# Patient Record
Sex: Female | Born: 1975 | Race: Black or African American | Hispanic: No | Marital: Single | Smoking: Current every day smoker
Health system: Southern US, Community
[De-identification: ages and names within clinical notes are randomized; demographics above are authoritative.]

## PROBLEM LIST (undated history)

## (undated) DIAGNOSIS — N921 Excessive and frequent menstruation with irregular cycle: Secondary | ICD-10-CM

## (undated) DIAGNOSIS — F4321 Adjustment disorder with depressed mood: Secondary | ICD-10-CM

## (undated) DIAGNOSIS — D500 Iron deficiency anemia secondary to blood loss (chronic): Secondary | ICD-10-CM

## (undated) DIAGNOSIS — G43909 Migraine, unspecified, not intractable, without status migrainosus: Secondary | ICD-10-CM

## (undated) DIAGNOSIS — D649 Anemia, unspecified: Secondary | ICD-10-CM

## (undated) DIAGNOSIS — R51 Headache: Secondary | ICD-10-CM

---

## 1998-01-25 ENCOUNTER — Inpatient Hospital Stay (HOSPITAL_COMMUNITY): Admission: AD | Admit: 1998-01-25 | Discharge: 1998-01-25 | Payer: Self-pay | Admitting: *Deleted

## 1998-04-12 ENCOUNTER — Ambulatory Visit (HOSPITAL_COMMUNITY): Admission: RE | Admit: 1998-04-12 | Discharge: 1998-04-12 | Payer: Self-pay | Admitting: Obstetrics & Gynecology

## 1998-04-21 ENCOUNTER — Inpatient Hospital Stay (HOSPITAL_COMMUNITY): Admission: AD | Admit: 1998-04-21 | Discharge: 1998-04-21 | Payer: Self-pay | Admitting: *Deleted

## 1998-04-22 ENCOUNTER — Inpatient Hospital Stay (HOSPITAL_COMMUNITY): Admission: AD | Admit: 1998-04-22 | Discharge: 1998-04-22 | Payer: Self-pay | Admitting: Obstetrics & Gynecology

## 1998-06-29 ENCOUNTER — Inpatient Hospital Stay (HOSPITAL_COMMUNITY): Admission: AD | Admit: 1998-06-29 | Discharge: 1998-07-03 | Payer: Self-pay | Admitting: Obstetrics

## 1999-08-05 ENCOUNTER — Encounter: Payer: Self-pay | Admitting: *Deleted

## 1999-08-05 ENCOUNTER — Inpatient Hospital Stay (HOSPITAL_COMMUNITY): Admission: AD | Admit: 1999-08-05 | Discharge: 1999-08-05 | Payer: Self-pay | Admitting: *Deleted

## 1999-09-24 ENCOUNTER — Observation Stay (HOSPITAL_COMMUNITY): Admission: AD | Admit: 1999-09-24 | Discharge: 1999-09-25 | Payer: Self-pay | Admitting: Obstetrics

## 1999-09-25 ENCOUNTER — Encounter: Payer: Self-pay | Admitting: Obstetrics

## 2000-12-03 HISTORY — PX: TUBAL LIGATION: SHX77

## 2001-01-14 ENCOUNTER — Inpatient Hospital Stay (HOSPITAL_COMMUNITY): Admission: AD | Admit: 2001-01-14 | Discharge: 2001-01-14 | Payer: Self-pay | Admitting: *Deleted

## 2001-03-06 ENCOUNTER — Inpatient Hospital Stay (HOSPITAL_COMMUNITY): Admission: AD | Admit: 2001-03-06 | Discharge: 2001-03-06 | Payer: Self-pay | Admitting: Obstetrics & Gynecology

## 2001-03-06 ENCOUNTER — Encounter: Payer: Self-pay | Admitting: Obstetrics & Gynecology

## 2001-04-11 ENCOUNTER — Encounter: Admission: RE | Admit: 2001-04-11 | Discharge: 2001-04-11 | Payer: Self-pay | Admitting: Family Medicine

## 2001-05-15 ENCOUNTER — Ambulatory Visit (HOSPITAL_COMMUNITY): Admission: RE | Admit: 2001-05-15 | Discharge: 2001-05-15 | Payer: Self-pay | Admitting: *Deleted

## 2001-06-02 ENCOUNTER — Inpatient Hospital Stay (HOSPITAL_COMMUNITY): Admission: AD | Admit: 2001-06-02 | Discharge: 2001-06-05 | Payer: Self-pay | Admitting: *Deleted

## 2001-07-02 ENCOUNTER — Encounter (INDEPENDENT_AMBULATORY_CARE_PROVIDER_SITE_OTHER): Payer: Self-pay | Admitting: Specialist

## 2001-07-02 ENCOUNTER — Inpatient Hospital Stay (HOSPITAL_COMMUNITY): Admission: AD | Admit: 2001-07-02 | Discharge: 2001-07-04 | Payer: Self-pay | Admitting: *Deleted

## 2001-09-15 ENCOUNTER — Emergency Department (HOSPITAL_COMMUNITY): Admission: EM | Admit: 2001-09-15 | Discharge: 2001-09-15 | Payer: Self-pay | Admitting: Emergency Medicine

## 2001-12-26 ENCOUNTER — Emergency Department (HOSPITAL_COMMUNITY): Admission: EM | Admit: 2001-12-26 | Discharge: 2001-12-26 | Payer: Self-pay | Admitting: Emergency Medicine

## 2002-12-14 ENCOUNTER — Inpatient Hospital Stay (HOSPITAL_COMMUNITY): Admission: AD | Admit: 2002-12-14 | Discharge: 2002-12-14 | Payer: Self-pay | Admitting: *Deleted

## 2002-12-16 ENCOUNTER — Inpatient Hospital Stay (HOSPITAL_COMMUNITY): Admission: AD | Admit: 2002-12-16 | Discharge: 2002-12-16 | Payer: Self-pay | Admitting: Obstetrics and Gynecology

## 2004-02-29 ENCOUNTER — Emergency Department (HOSPITAL_COMMUNITY): Admission: EM | Admit: 2004-02-29 | Discharge: 2004-02-29 | Payer: Self-pay | Admitting: Emergency Medicine

## 2006-02-22 ENCOUNTER — Emergency Department (HOSPITAL_COMMUNITY): Admission: EM | Admit: 2006-02-22 | Discharge: 2006-02-23 | Payer: Self-pay | Admitting: Emergency Medicine

## 2008-05-06 ENCOUNTER — Emergency Department (HOSPITAL_COMMUNITY): Admission: EM | Admit: 2008-05-06 | Discharge: 2008-05-07 | Payer: Self-pay | Admitting: Emergency Medicine

## 2008-09-12 ENCOUNTER — Emergency Department (HOSPITAL_COMMUNITY): Admission: EM | Admit: 2008-09-12 | Discharge: 2008-09-12 | Payer: Self-pay | Admitting: Emergency Medicine

## 2009-06-16 IMAGING — CT CT ABDOMEN W/O CM
2 of 4 series · 14 of 32 positions shown, 19 images · non-contrast
Comparison: None.

CT ABDOMEN

CLINICAL DATA: Left flank pain.  Question ureteral calculus.

CT ABDOMEN AND PELVIS WITHOUT CONTRAST
TECHNIQUE: Multidetector CT imaging of the abdomen and pelvis was
performed following the standard
protocol without intravenous contrast.

[Series 2: <(id) stone a/p >(id) · axial · 0.74mm/px · z∈[-456,-126]mm · 7 of 88 slices shown, 12 images]
[im 11/88  soft-tissue]
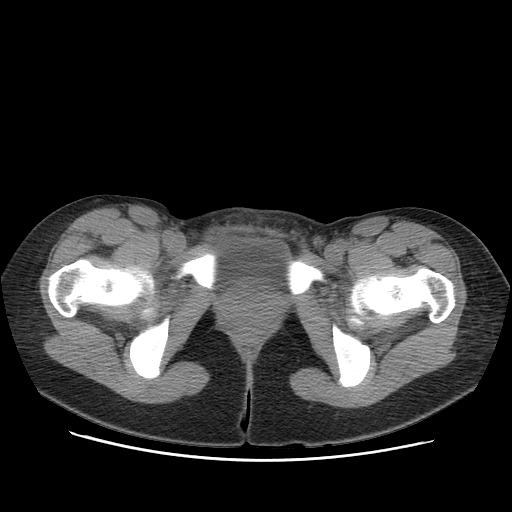
[im 11/88  bone]
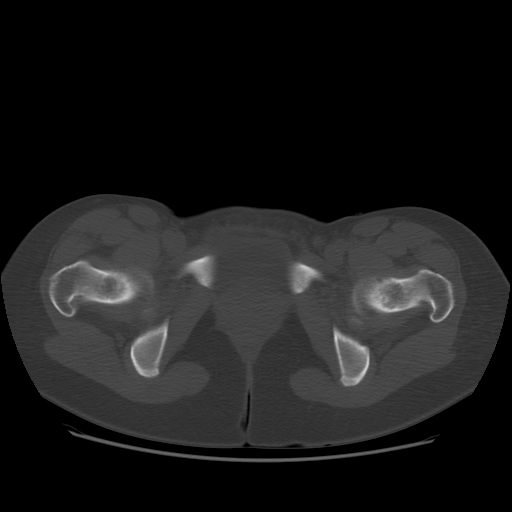
[im 22/88  soft-tissue]
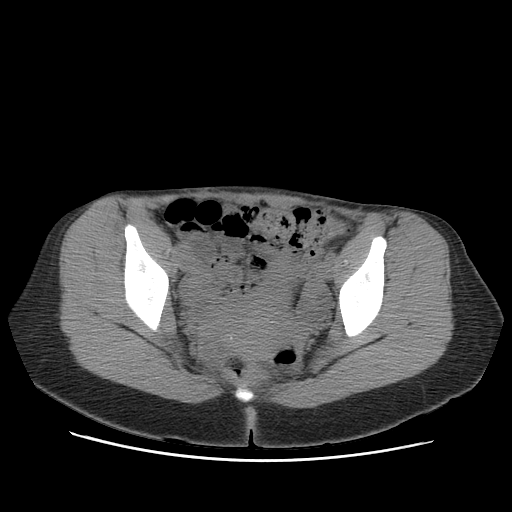
[im 33/88  soft-tissue]
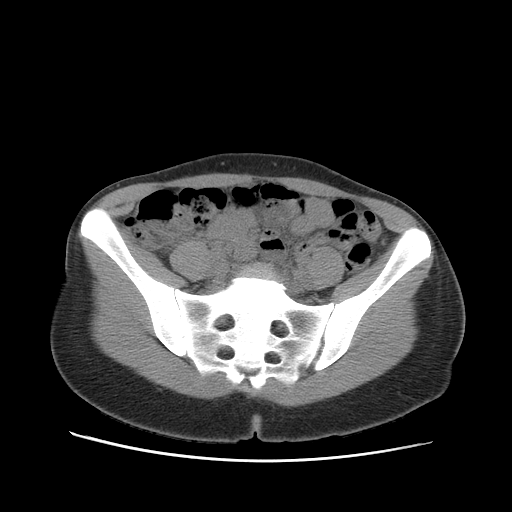
[im 44/88  soft-tissue]
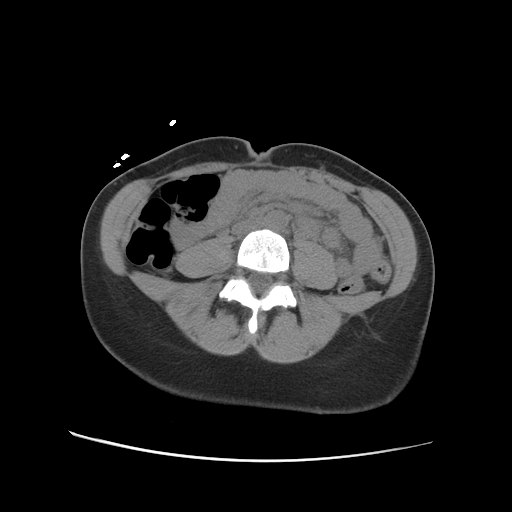
[im 44/88  lung]
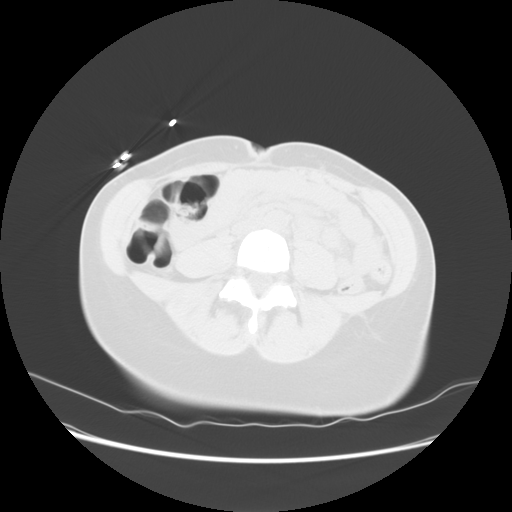
[im 55/88  soft-tissue]
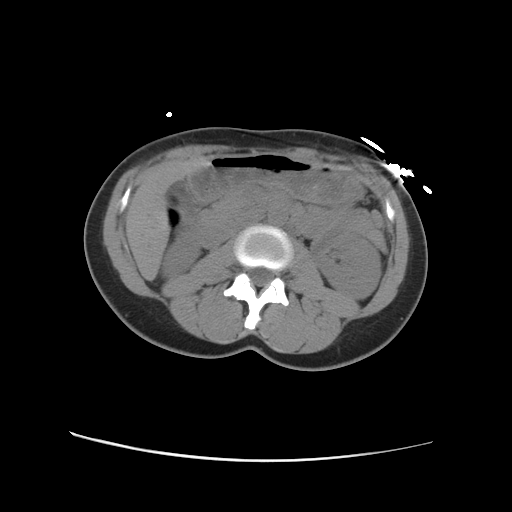
[im 55/88  lung]
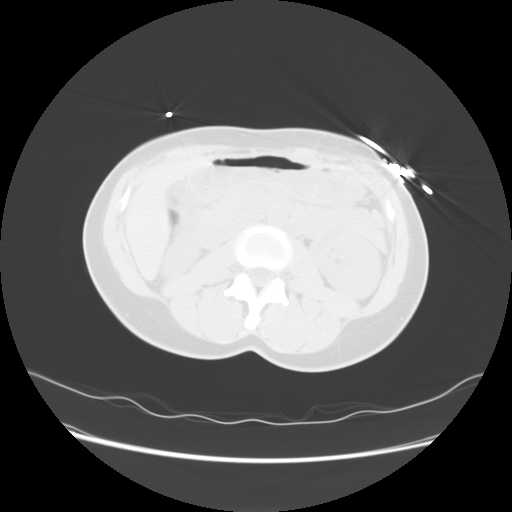
[im 66/88  soft-tissue]
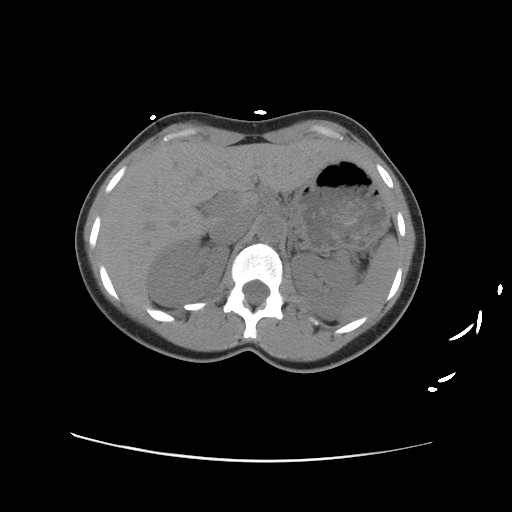
[im 66/88  lung]
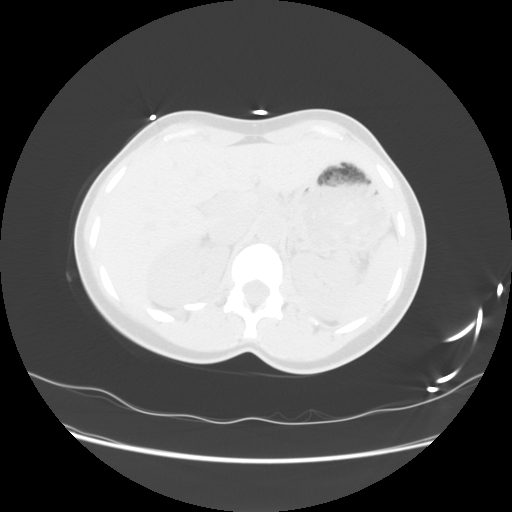
[im 77/88  soft-tissue]
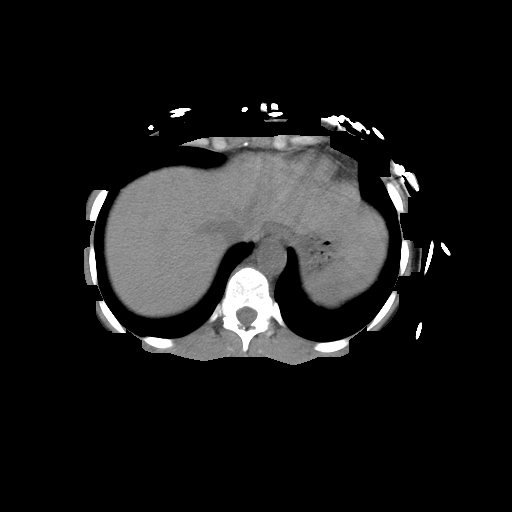
[im 77/88  lung]
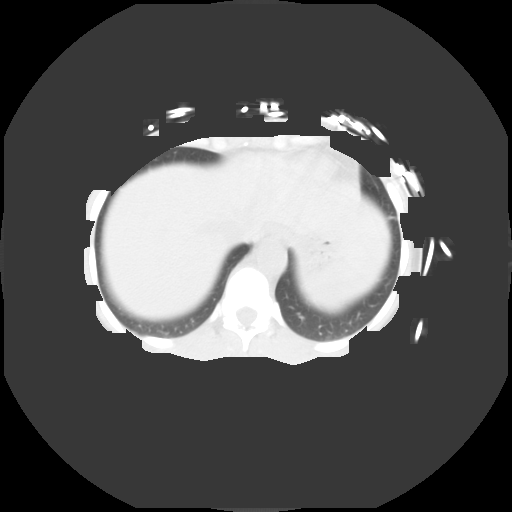

[Series 400: reformatted · sagittal · 0.87mm/px · 7 of 91 slices shown]
[im 10/91  soft-tissue]
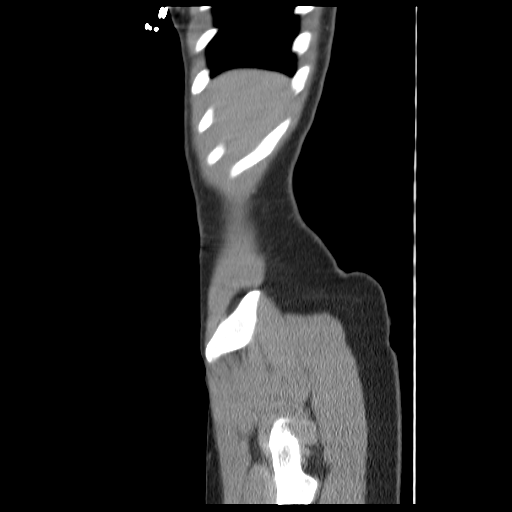
[im 19/91  soft-tissue]
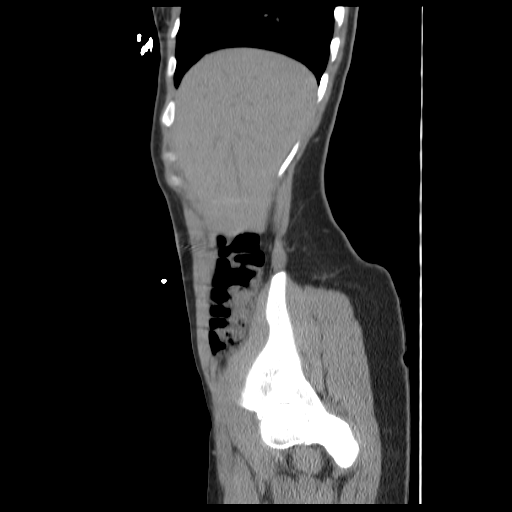
[im 28/91  soft-tissue]
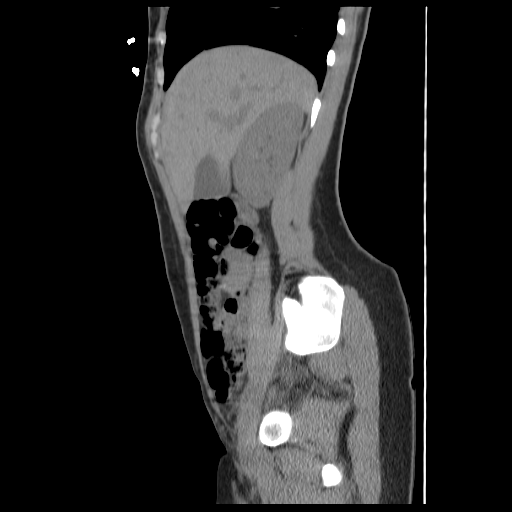
[im 37/91  soft-tissue]
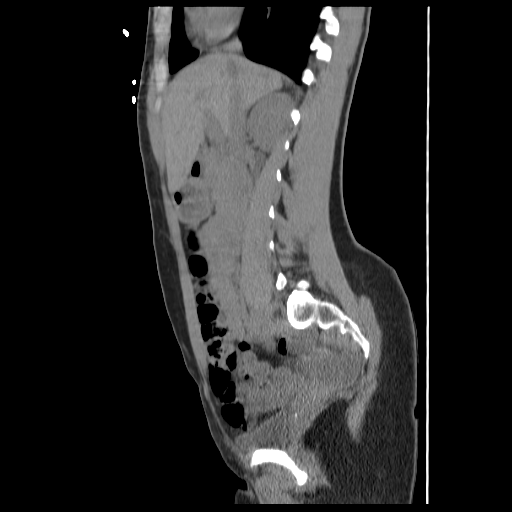
[im 55/91  soft-tissue]
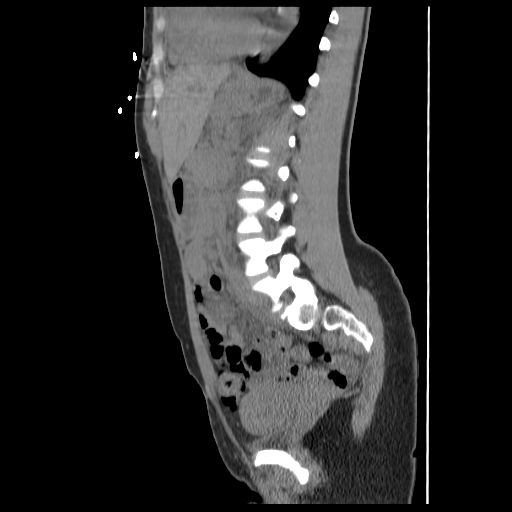
[im 64/91  soft-tissue]
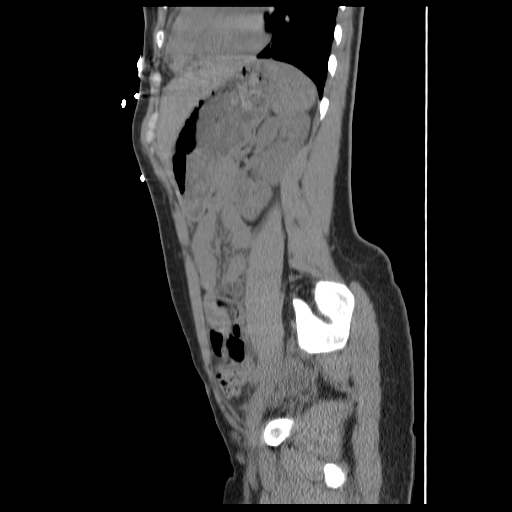
[im 73/91  soft-tissue]
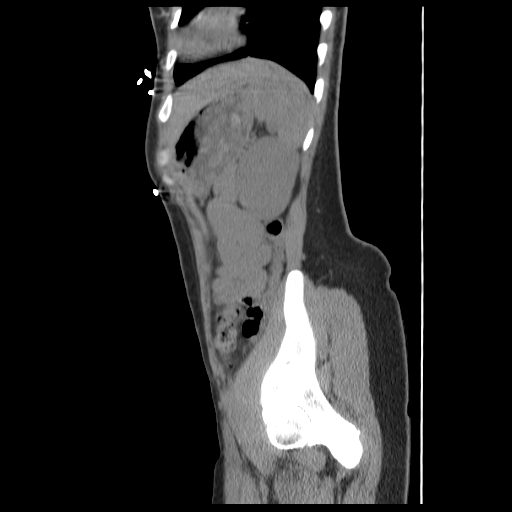

[14 of 32 positions shown; findings below may reference images not displayed]

FINDINGS: The patient has limited intra-abdominal fat.  No renal
calculi are demonstrated.  There is no hydronephrosis or
perinephric soft tissue stranding.  No intra-abdominal ureteral
calculi are demonstrated.  The kidneys appear unremarkable as
imaged in the unenhanced state.

The lung bases are clear.  The remainder the visualized abdomen
appears unremarkable as imaged in the unenhanced state, but as such
is suboptimally evaluated.
IMPRESSION: 1.  Negative for urinary tract calculus or hydronephrosis.
2.  No acute abdominal findings are demonstrated.

CT PELVIS
FINDINGS: The ureters are not dilated and are difficult to trace
into the pelvis.  No ureteral or bladder calculus is seen.  There
are small bilateral pelvic calcifications which are probably all
phleboliths.  The uterus and ovaries appear unremarkable.  No
inflammatory changes are evident.
IMPRESSION: 1.  No evidence of ureteral calculus or hydronephrosis. The distal
ureters are difficult to individually trace to the bladder.
2.  No demonstrated pelvic inflammatory process.

## 2010-03-21 ENCOUNTER — Emergency Department (HOSPITAL_COMMUNITY): Admission: EM | Admit: 2010-03-21 | Discharge: 2010-03-21 | Payer: Self-pay | Admitting: Emergency Medicine

## 2011-04-20 NOTE — Discharge Summary (Signed)
[facility]  Patient:    Diana Mcdowell, Diana Mcdowell                      MRN: [id number] Adm. Date:  [id number] Disc. Date: [id number] Attending:  Tammi Sou Dictator:   Laruth Bouchard, M.D.                           Discharge Summary  DISCHARGE DIAGNOSES:          1. Uterine irritability.                               2. Cervicitis.  DISCHARGE MEDICATIONS:        1. Prenatal vitamins.                               2. Ferrous sulfate 325 mg one p.o. b.i.d. with                                  food.  HISTORY OF PRESENT ILLNESS:   The patient is a 35 year old, gravida 4, para 2-1-0-3, who presented at 36 weeks with complaints of contractions and loss of mucous plug.  The patient denied any vaginal bleeding or leakage of fluid. The patient receives prenatal care at [facility].  This pregnancy has been complicated by bacterial vaginosis and anemia.  PHYSICAL EXAMINATION:         VITAL SIGNS: Stable.  The patient is afebrile. HEART: Regular rate and rhythm. LUNGS: Clear to auscultation bilaterally. ABDOMEN: Nonacute, nontender.  EXTREMITIES: No edema. Fetal heart rate tracing is reassuring with contractions occurring every two to three minutes and lasting for 30 to 40 seconds of moderate intensity.  Wet prep results revealed positive Wif test, large WBCs, 4+ bacteria, and lots of clue cells.  HOSPITAL COURSE:              The patients vital signs remained stable.  The patient was afebrile throughout hospital course.  For contractions, the patient was started on Terbutaline 0.25 mg s.q.  Contractions decreased and no cervical change was noted.  For the cervicitis, the patient was treated with Unasyn 3 grams IV q.6h.  CONDITION ON DISCHARGE:       Stable.  FOLLOW-UP:                    At [facility] next week, the patient has an appointment scheduled. DD:  06/05/01 TD:  06/05/01 Job: [id number]

## 2011-04-20 NOTE — Op Note (Signed)
[facility]  Patient:    Diana Mcdowell, Diana Mcdowell                      MRN: [id number] Proc. Date: 07/03/01 Adm. Date:  [id number] Attending:  Tammi Sou                           Operative Report  PREOPERATIVE DIAGNOSIS:       Multiparity, desires sterilization procedure.  POSTOPERATIVE DIAGNOSIS:      Multiparity, desires sterilization procedure.  OPERATION:                    Modified Pomeroy bilateral tubal ligation.  SURGEON:                      Roseanna Rainbow, M.D.  ASSISTANT:  ANESTHESIA:                   General endotracheal anesthesia.  ESTIMATED BLOOD LOSS:         Less than 50 cc.  COMPLICATIONS:                None.  FLUIDS:                       As per anesthesiology.  INDICATIONS:                  The patient is a 35 year old, para 3, status post NSVD, who desires a sterilization procedure.  The risks, benefits, and alternative forms of management were reviewed with the patient including, but not limited to failure rate of 4 to 8 per 1000 cases with a subsequent increase risk of ectopic pregnancy.  DESCRIPTION OF PROCEDURE:     The patient was taken to the operating room. General anesthesia was administered without difficulty.  She was then placed in the dorsal supine position and prepped and draped in the usual sterile fashion.  Previous subumbilical scar was then infiltrated with Marcaine.  This scar was then incised with a scalpel and carried down through the fascia.  The parietoperitoneum was then tented up and entered sharply with Metzenbaum scissors.  This incision was then extended bilaterally.  The incision was felt to be free of any adhesions.  The left fallopian tube was then grasped with a Babcock clamp and the fimbriated end was visualized.  The tube was then grasped approximately 4 cm from the uterine cornu.  Two ligatures of 0 plain were then placed around the loop of tube.  This loop of tube was  then excised. Excellent hemostasis was noted.  The right fallopian tube was then manipulated in a similar fashion.  The fascia was reapproximated with 0 Vicryl.  The skin was reapproximated in a subcuticular fashion with 3-0 Vicryl.  Excellent hemostasis was noted.  At the close of the procedure, the instrument and pack counts were said to be correct x 2.  The patient was taken to the PACU awake and in stable condition. DD:  07/03/01 TD:  07/03/01 Job: [id number]

## 2011-08-30 LAB — POCT CARDIAC MARKERS
CKMB, poc: <1 — ABNORMAL LOW
Troponin i, poc: <0.05

## 2011-08-30 LAB — COMPREHENSIVE METABOLIC PANEL
Alkaline Phosphatase: 46
BUN: 7
Calcium: 9.2
Glucose, Bld: 93
Potassium: 3.1 — ABNORMAL LOW
Total Protein: 7.5

## 2011-08-30 LAB — CBC
HCT: 33.3 — ABNORMAL LOW
Hemoglobin: 11 — ABNORMAL LOW
MCHC: 33.2
RDW: 16.1 — ABNORMAL HIGH

## 2011-08-30 LAB — DIFFERENTIAL
Basophils Relative: 1
Monocytes Relative: 8
Neutro Abs: 6
Neutrophils Relative %: 55

## 2011-08-30 LAB — URINALYSIS, ROUTINE W REFLEX MICROSCOPIC
Glucose, UA: NEGATIVE
Nitrite: NEGATIVE
Protein, ur: NEGATIVE
Urobilinogen, UA: 1

## 2011-08-30 LAB — URINE MICROSCOPIC-ADD ON

## 2011-08-30 LAB — LIPASE, BLOOD: Lipase: 19

## 2015-10-05 ENCOUNTER — Emergency Department (HOSPITAL_COMMUNITY)
Admission: EM | Admit: 2015-10-05 | Discharge: 2015-10-05 | Disposition: A | Discharge status: Home / Self Care | Payer: Self-pay | Attending: Emergency Medicine | Admitting: Emergency Medicine

## 2015-10-05 ENCOUNTER — Other Ambulatory Visit: Payer: Self-pay

## 2015-10-05 ENCOUNTER — Emergency Department (HOSPITAL_COMMUNITY): Payer: Self-pay

## 2015-10-05 ENCOUNTER — Encounter (HOSPITAL_COMMUNITY): Payer: Self-pay | Admitting: *Deleted

## 2015-10-05 DIAGNOSIS — D591 Other autoimmune hemolytic anemias: Secondary | ICD-10-CM | POA: Insufficient documentation

## 2015-10-05 DIAGNOSIS — R0789 Other chest pain: Secondary | ICD-10-CM | POA: Insufficient documentation

## 2015-10-05 DIAGNOSIS — D649 Anemia, unspecified: Secondary | ICD-10-CM

## 2015-10-05 DIAGNOSIS — Z720 Tobacco use: Secondary | ICD-10-CM | POA: Insufficient documentation

## 2015-10-05 DIAGNOSIS — F5089 Other specified eating disorder: Secondary | ICD-10-CM | POA: Insufficient documentation

## 2015-10-05 LAB — CBC
HEMATOCRIT: 28.7 % — AB (ref 36.0–46.0)
Hemoglobin: 8.5 g/dL — ABNORMAL LOW (ref 12.0–15.0)
MCH: 19.8 pg — ABNORMAL LOW (ref 26.0–34.0)
MCHC: 29.6 g/dL — AB (ref 30.0–36.0)
MCV: 66.7 fL — ABNORMAL LOW (ref 78.0–100.0)
PLATELETS: 456 10*3/uL — AB (ref 150–400)
RBC: 4.3 MIL/uL (ref 3.87–5.11)
RDW: 19.3 % — AB (ref 11.5–15.5)
WBC: 8.7 10*3/uL (ref 4.0–10.5)

## 2015-10-05 LAB — BASIC METABOLIC PANEL
Anion gap: 9 (ref 5–15)
BUN: 9 mg/dL (ref 6–20)
CALCIUM: 9.4 mg/dL (ref 8.9–10.3)
CO2: 22 mmol/L (ref 22–32)
CREATININE: 0.76 mg/dL (ref 0.44–1.00)
Chloride: 105 mmol/L (ref 101–111)
GFR calc Af Amer: >60 mL/min (ref >60)
GLUCOSE: 87 mg/dL (ref 65–99)
POTASSIUM: 3.6 mmol/L (ref 3.5–5.1)
SODIUM: 136 mmol/L (ref 135–145)

## 2015-10-05 LAB — I-STAT TROPONIN, ED: Troponin i, poc: 0 ng/mL (ref 0.00–0.08)

## 2015-10-05 NOTE — ED Provider Notes (Signed)
CSN: [id number]     Arrival date & time 10/05/15  1438 History   First MD Initiated Contact with Patient 10/05/15 1624     Chief Complaint  Patient presents with  . Chest Pain     (Consider location/radiation/quality/duration/timing/severity/associated sxs/prior Treatment) HPI patient presents with concern of chest pain. Patient has been present intermittently for days, and the most recent episode was last night. Episodes occur without any clear precipitant, stop without any clear intervention. H episode lasts moments. Is diffuse, with mild associated dyspnea. There is no pleuritic pain, no radiation of the pain, the pain is anterior, tight. Patient has no other ongoing complaint, including no abdominal pain, lightheadedness, syncope, nausea, fever, chills. Patient denies substantial medical problems, but eventually acknowledges history of anemia, intermittently in the past. Patient is notable recent social history of her son being assassinated 3 weeks ago. Patient smokes.  Smoking cessation provided, particularly in light of this patient's evaluation in the ED.   History reviewed. No pertinent past medical history. History reviewed. No pertinent past surgical history. No family history on file. Social History  Substance Use Topics  . Smoking status: Current Every Day Smoker -- 0.50 packs/day    Types: Cigarettes  . Smokeless tobacco: None  . Alcohol Use: None   OB History    No data available     Review of Systems  Constitutional:       Per HPI, otherwise negative  HENT:       Per HPI, otherwise negative  Respiratory:       Per HPI, otherwise negative  Cardiovascular:       Per HPI, otherwise negative  Gastrointestinal: Negative for vomiting.  Endocrine:       Negative aside from HPI  Genitourinary:       Neg aside from HPI   Musculoskeletal:       Per HPI, otherwise negative  Skin: Negative.   Neurological: Negative for syncope.      Allergies  Review of  patient's allergies indicates no known allergies.  Home Medications   Prior to Admission medications   Medication Sig Start Date End Date Taking? Authorizing Provider  Naproxen Sod-Diphenhydramine (ALEVE PM) 220-25 MG TABS Take 2 tablets by mouth at bedtime as needed (pain, headaches).   Yes Historical Provider, MD   BP 140/96 mmHg  Pulse 65  Temp(Src) 98.6 F (37 C) (Oral)  Resp 15  SpO2 100%  LMP 09/05/2015 (Approximate) Physical Exam  Constitutional: She is oriented to person, place, and time. She appears well-developed and well-nourished. No distress.  HENT:  Head: Normocephalic and atraumatic.  Eyes: Conjunctivae and EOM are normal.  Cardiovascular: Normal rate and regular rhythm.   Pulmonary/Chest: Effort normal and breath sounds normal. No stridor. No respiratory distress.  Abdominal: She exhibits no distension.  Musculoskeletal: She exhibits no edema.  Neurological: She is alert and oriented to person, place, and time. No cranial nerve deficit.  Skin: Skin is warm and dry.  Psychiatric: She has a normal mood and affect.  Nursing note and vitals reviewed.   ED Course  Procedures (including critical care time) Labs Review Labs Reviewed  CBC - Abnormal; Notable for the following:    Hemoglobin 8.5 (*)    HCT 28.7 (*)    MCV 66.7 (*)    MCH 19.8 (*)    MCHC 29.6 (*)    RDW 19.3 (*)    Platelets 456 (*)    All other components within normal limits  BASIC METABOLIC  PANEL  Rosezena Sensor, ED    Imaging Review Dg Chest 2 View  10/05/2015  CLINICAL DATA:  Three-week history of shortness of breath with chest pain EXAM: CHEST  2 VIEW COMPARISON:  September 12, 2008 FINDINGS: Lungs are clear. The heart size is upper normal with pulmonary vascularity within normal limits. No adenopathy. No pneumothorax. No bone lesions. IMPRESSION: No edema or consolidation. Electronically Signed   By: Bretta Bang III M.D.   On: 10/05/2015 15:18   I have personally reviewed and  evaluated these images and lab results as part of my medical decision-making.   EKG Interpretation   Date/Time:  Wednesday October 05 2015 14:41:20 EDT Ventricular Rate:  84 PR Interval:  150 QRS Duration: 80 QT Interval:  378 QTC Calculation: 446 R Axis:   42 Text Interpretation:  Normal sinus rhythm with sinus arrhythmia Possible  Anterior infarct , age undetermined Abnormal ECG Sinus rhythm Artifact  Borderline ECG Confirmed by Gerhard Munch  MD [phone]) on 10/05/2015  4:30:05 PM     On repeat exam the patient is in no distress, we discussed all findings at length. She now states that she has intermittent episodes of consuming dry goods, including increased frequency of eating tissue paper currently.  MDM   Final diagnoses:  Atypical chest pain  Symptomatic anemia  Pica   generally well-appearing female presents with intermittent chest pain, dyspnea. Here, no evidence for ongoing coronary ischemia, pulmonary embolism, infection. With anemia, consistent with iron deficiency, there suspicion for symptomatically anemia. No ongoing blood loss. Patient's increased pica is consistent with this as well. Patient encouraged to take multivitamin, with iron, folic acid, will follow-up with primary care in 1 week.    Gerhard Munch, MD 10/05/15 [phone]

## 2015-10-05 NOTE — ED Notes (Signed)
Pt states that she began having chest pain last night. Pt states that it is constant with intermittent sharp pain. Pt reports some SOB.

## 2015-10-05 NOTE — Discharge Instructions (Signed)
As discussed, although today's evaluation has been largely reassuring, there is some evidence for anemia.  It is important that he follow up with your primary care physician, and in one week have a repeat blood draw.  Please take an over-the-counter multivitamin with iron daily.  You may purchase the multivitamin of your choice at any of the local area pharmacies.  Return here for concerning changes in your condition.

## 2017-05-06 ENCOUNTER — Encounter (HOSPITAL_COMMUNITY): Payer: Self-pay

## 2017-05-06 ENCOUNTER — Inpatient Hospital Stay (HOSPITAL_COMMUNITY)
Admission: AD | Admit: 2017-05-06 | Discharge: 2017-05-06 | Disposition: A | Discharge status: Home / Self Care | Payer: Self-pay | Source: Ambulatory Visit | Attending: Obstetrics and Gynecology | Admitting: Obstetrics and Gynecology

## 2017-05-06 DIAGNOSIS — N390 Urinary tract infection, site not specified: Secondary | ICD-10-CM | POA: Insufficient documentation

## 2017-05-06 DIAGNOSIS — F17210 Nicotine dependence, cigarettes, uncomplicated: Secondary | ICD-10-CM | POA: Insufficient documentation

## 2017-05-06 DIAGNOSIS — G43909 Migraine, unspecified, not intractable, without status migrainosus: Secondary | ICD-10-CM | POA: Insufficient documentation

## 2017-05-06 HISTORY — DX: Migraine, unspecified, not intractable, without status migrainosus: G43.909

## 2017-05-06 HISTORY — DX: Anemia, unspecified: D64.9

## 2017-05-06 LAB — URINALYSIS, ROUTINE W REFLEX MICROSCOPIC
BILIRUBIN URINE: NEGATIVE
Glucose, UA: NEGATIVE mg/dL
KETONES UR: NEGATIVE mg/dL
LEUKOCYTES UA: NEGATIVE
Nitrite: NEGATIVE
PH: 5 (ref 5.0–8.0)
Protein, ur: 30 mg/dL — AB
SPECIFIC GRAVITY, URINE: 1.029 (ref 1.005–1.030)

## 2017-05-06 LAB — WET PREP, GENITAL
Clue Cells Wet Prep HPF POC: NONE SEEN
SPERM: NONE SEEN
Trich, Wet Prep: NONE SEEN
Yeast Wet Prep HPF POC: NONE SEEN

## 2017-05-06 MED ORDER — SULFAMETHOXAZOLE-TRIMETHOPRIM 800-160 MG PO TABS: 1.0000 | ORAL_TABLET | Freq: Two times a day (BID) | ORAL | 0 refills | Status: AC

## 2017-05-06 NOTE — MAU Provider Note (Signed)
History     CSN: [id number]  Arrival date and time: 05/06/17 7829   First Provider Initiated Contact with Patient 05/06/17 [phone]      Chief Complaint  Patient presents with  . Urinary Tract Infection  . Abdominal Pain  . Vaginal Bleeding   HPI   Diana Mcdowell is a 41 y.o. female [phone] here in MAU with concerns about a UTI. She has urgency, frequency and dysuria that started 4 days ago.  Her LMP started 5/23 and lasted 9 days which was a normal cycle for her. This morning she woke up to vaginal bleeding; this is not uncommon for her. Her periods are abnormal.  She does not have PCP, or GYN.  OB History    Gravida Para Term Preterm AB Living   6 5 4 1 1      SAB TAB Ectopic Multiple Live Births   1              Past Medical History:  Diagnosis Date  . Anemia   . Migraines     Past Surgical History:  Procedure Laterality Date  . TUBAL LIGATION      Family History  Problem Relation Age of Onset  . Hypertension Mother   . Diabetes Mother   . Cancer Maternal Grandfather   . Diabetes Paternal Grandmother     Social History  Substance Use Topics  . Smoking status: Current Every Day Smoker    Packs/day: 0.50    Types: Cigarettes  . Smokeless tobacco: Never Used  . Alcohol use No    Allergies: No Known Allergies  Prescriptions Prior to Admission  Medication Sig Dispense Refill Last Dose  . Naproxen Sod-Diphenhydramine (ALEVE PM) 220-25 MG TABS Take 2 tablets by mouth at bedtime as needed (pain, headaches).   10/04/2015 at Unknown time   Results for orders placed or performed during the hospital encounter of 05/06/17 (from the past 48 hour(s))  Urinalysis, Routine w reflex microscopic     Status: Abnormal   Collection Time: 05/06/17  8:45 AM  Result Value Ref Range   Color, Urine YELLOW YELLOW   APPearance CLEAR CLEAR   Specific Gravity, Urine 1.029 1.005 - 1.030   pH 5.0 5.0 - 8.0   Glucose, UA NEGATIVE NEGATIVE mg/dL   Hgb urine dipstick LARGE (A)  NEGATIVE   Bilirubin Urine NEGATIVE NEGATIVE   Ketones, ur NEGATIVE NEGATIVE mg/dL   Protein, ur 30 (A) NEGATIVE mg/dL   Nitrite NEGATIVE NEGATIVE   Leukocytes, UA NEGATIVE NEGATIVE   RBC / HPF TOO NUMEROUS TO COUNT 0 - 5 RBC/hpf   WBC, UA 0-5 0 - 5 WBC/hpf   Bacteria, UA RARE (A) NONE SEEN   Squamous Epithelial / LPF 0-5 (A) NONE SEEN   Mucous PRESENT    Hyaline Casts, UA PRESENT   Wet prep, genital     Status: Abnormal   Collection Time: 05/06/17 10:00 AM  Result Value Ref Range   Yeast Wet Prep HPF POC NONE SEEN NONE SEEN   Trich, Wet Prep NONE SEEN NONE SEEN   Clue Cells Wet Prep HPF POC NONE SEEN NONE SEEN   WBC, Wet Prep HPF POC FEW (A) NONE SEEN    Comment: FEW BACTERIA SEEN   Sperm NONE SEEN    Review of Systems  Genitourinary: Positive for decreased urine volume, dysuria, frequency, urgency and vaginal bleeding.  Musculoskeletal: Positive for back pain.   Physical Exam   Blood pressure 126/87, pulse 73, temperature 98.3 F (  36.8 C), temperature source Oral, resp. rate 18, height 5\' 4"  (1.626 m), weight 132 lb (59.9 kg), last menstrual period 04/23/2017.  Physical Exam  Constitutional: She is oriented to person, place, and time. She appears well-developed and well-nourished. No distress.  HENT:  Head: Normocephalic.  Eyes: Pupils are equal, round, and reactive to light.  GI: Soft.  Genitourinary:  Genitourinary Comments: Wet prep and GC collected without speculum.   Musculoskeletal: Normal range of motion.  Neurological: She is alert and oriented to person, place, and time.  Skin: Skin is warm. She is not diaphoretic.  Psychiatric: Her behavior is normal.   MAU Course  Procedures  None  MDM  Wet prep & GC UA  Assessment and Plan   A:  1. Acute UTI     P:  Discharge home in stable condition Rx: Bactrim  Return to MAU if symptoms worsen, for emergencies only Discussed importance of GYN care.   Duane Lopeasch, Jennifer I, NP 05/06/2017 12:55 PM

## 2017-05-06 NOTE — Discharge Instructions (Signed)

## 2017-05-06 NOTE — MAU Note (Signed)
Reports potential UTI, burning with urination starting this AM, increased frequency, increased urgency. Reports lower back and stomach pain. Got off menstrual cycle 9 days ago but this morning when she went to the bathroom pt reports passing a quarter sized clot and has been bleeding since

## 2017-05-07 LAB — GC/CHLAMYDIA PROBE AMP (~~LOC~~) NOT AT ARMC
CHLAMYDIA, DNA PROBE: NEGATIVE
NEISSERIA GONORRHEA: NEGATIVE

## 2017-05-07 LAB — HIV ANTIBODY (ROUTINE TESTING W REFLEX): HIV SCREEN 4TH GENERATION: NONREACTIVE

## 2017-05-11 ENCOUNTER — Encounter (HOSPITAL_COMMUNITY): Payer: Self-pay | Admitting: Emergency Medicine

## 2017-05-11 ENCOUNTER — Emergency Department (HOSPITAL_COMMUNITY): Payer: Self-pay

## 2017-05-11 ENCOUNTER — Inpatient Hospital Stay (HOSPITAL_COMMUNITY)
Admission: EM | Admit: 2017-05-11 | Discharge: 2017-05-13 | DRG: 760 | Disposition: A | Discharge status: Home / Self Care | Payer: Self-pay | Attending: Internal Medicine | Admitting: Internal Medicine

## 2017-05-11 DIAGNOSIS — F4329 Adjustment disorder with other symptoms: Secondary | ICD-10-CM | POA: Diagnosis present

## 2017-05-11 DIAGNOSIS — R0789 Other chest pain: Secondary | ICD-10-CM | POA: Diagnosis present

## 2017-05-11 DIAGNOSIS — G8929 Other chronic pain: Secondary | ICD-10-CM | POA: Diagnosis present

## 2017-05-11 DIAGNOSIS — N921 Excessive and frequent menstruation with irregular cycle: Principal | ICD-10-CM

## 2017-05-11 DIAGNOSIS — G4700 Insomnia, unspecified: Secondary | ICD-10-CM

## 2017-05-11 DIAGNOSIS — K922 Gastrointestinal hemorrhage, unspecified: Secondary | ICD-10-CM | POA: Diagnosis present

## 2017-05-11 DIAGNOSIS — K254 Chronic or unspecified gastric ulcer with hemorrhage: Secondary | ICD-10-CM | POA: Diagnosis present

## 2017-05-11 DIAGNOSIS — F4321 Adjustment disorder with depressed mood: Secondary | ICD-10-CM | POA: Diagnosis present

## 2017-05-11 DIAGNOSIS — K921 Melena: Secondary | ICD-10-CM | POA: Diagnosis present

## 2017-05-11 DIAGNOSIS — D649 Anemia, unspecified: Secondary | ICD-10-CM

## 2017-05-11 DIAGNOSIS — D500 Iron deficiency anemia secondary to blood loss (chronic): Secondary | ICD-10-CM | POA: Diagnosis present

## 2017-05-11 DIAGNOSIS — F4381 Prolonged grief disorder: Secondary | ICD-10-CM | POA: Diagnosis present

## 2017-05-11 DIAGNOSIS — Z7982 Long term (current) use of aspirin: Secondary | ICD-10-CM

## 2017-05-11 DIAGNOSIS — F4322 Adjustment disorder with anxiety: Secondary | ICD-10-CM | POA: Diagnosis present

## 2017-05-11 DIAGNOSIS — R51 Headache: Secondary | ICD-10-CM

## 2017-05-11 DIAGNOSIS — R1013 Epigastric pain: Secondary | ICD-10-CM | POA: Diagnosis present

## 2017-05-11 DIAGNOSIS — Z79899 Other long term (current) drug therapy: Secondary | ICD-10-CM

## 2017-05-11 DIAGNOSIS — F17210 Nicotine dependence, cigarettes, uncomplicated: Secondary | ICD-10-CM | POA: Diagnosis present

## 2017-05-11 DIAGNOSIS — R519 Headache, unspecified: Secondary | ICD-10-CM | POA: Diagnosis present

## 2017-05-11 DIAGNOSIS — T39395A Adverse effect of other nonsteroidal anti-inflammatory drugs [NSAID], initial encounter: Secondary | ICD-10-CM | POA: Diagnosis present

## 2017-05-11 DIAGNOSIS — D62 Acute posthemorrhagic anemia: Secondary | ICD-10-CM

## 2017-05-11 HISTORY — DX: Headache: R51

## 2017-05-11 HISTORY — DX: Iron deficiency anemia secondary to blood loss (chronic): D50.0

## 2017-05-11 HISTORY — DX: Adjustment disorder with depressed mood: F43.21

## 2017-05-11 HISTORY — DX: Excessive and frequent menstruation with irregular cycle: N92.1

## 2017-05-11 LAB — HEPATIC FUNCTION PANEL
ALT: 11 U/L — ABNORMAL LOW (ref 14–54)
AST: 26 U/L (ref 15–41)
Albumin: 4.1 g/dL (ref 3.5–5.0)
Alkaline Phosphatase: 32 U/L — ABNORMAL LOW (ref 38–126)
TOTAL PROTEIN: 7.2 g/dL (ref 6.5–8.1)
Total Bilirubin: 0.2 mg/dL — ABNORMAL LOW (ref 0.3–1.2)

## 2017-05-11 LAB — BASIC METABOLIC PANEL
ANION GAP: 6 (ref 5–15)
BUN: 10 mg/dL (ref 6–20)
CALCIUM: 9.4 mg/dL (ref 8.9–10.3)
CO2: 22 mmol/L (ref 22–32)
Chloride: 106 mmol/L (ref 101–111)
Creatinine, Ser: 0.73 mg/dL (ref 0.44–1.00)
GLUCOSE: 92 mg/dL (ref 65–99)
Potassium: 4 mmol/L (ref 3.5–5.1)
SODIUM: 134 mmol/L — AB (ref 135–145)

## 2017-05-11 LAB — ABO/RH: ABO/RH(D): O POS

## 2017-05-11 LAB — LIPASE, BLOOD: Lipase: 20 U/L (ref 11–51)

## 2017-05-11 LAB — I-STAT TROPONIN, ED: TROPONIN I, POC: 0 ng/mL (ref 0.00–0.08)

## 2017-05-11 LAB — CBC
HCT: 23.3 % — ABNORMAL LOW (ref 36.0–46.0)
HEMOGLOBIN: 6.5 g/dL — AB (ref 12.0–15.0)
MCH: 17.3 pg — ABNORMAL LOW (ref 26.0–34.0)
MCHC: 27.9 g/dL — AB (ref 30.0–36.0)
MCV: 62 fL — ABNORMAL LOW (ref 78.0–100.0)
Platelets: 579 10*3/uL — ABNORMAL HIGH (ref 150–400)
RBC: 3.76 MIL/uL — ABNORMAL LOW (ref 3.87–5.11)
RDW: 21.1 % — ABNORMAL HIGH (ref 11.5–15.5)
WBC: 7 10*3/uL (ref 4.0–10.5)

## 2017-05-11 LAB — POC OCCULT BLOOD, ED: Fecal Occult Bld: NEGATIVE

## 2017-05-11 LAB — PREPARE RBC (CROSSMATCH)

## 2017-05-11 MED ORDER — PANTOPRAZOLE SODIUM 40 MG IV SOLR: 40.0000 mg | Freq: Two times a day (BID) | INTRAVENOUS | Status: DC

## 2017-05-11 MED ORDER — LORAZEPAM 2 MG/ML IJ SOLN
0.5000 mg | Freq: Four times a day (QID) | INTRAMUSCULAR | Status: DC | PRN
  Administered 2017-05-12 (×2): 0.5 mg via INTRAVENOUS
  Filled 2017-05-11 (×3): qty 1

## 2017-05-11 MED ORDER — SODIUM CHLORIDE 0.9 % IV SOLN
8.0000 mg/h | INTRAVENOUS | Status: DC
  Administered 2017-05-12: 8 mg/h via INTRAVENOUS
  Filled 2017-05-11 (×2): qty 80

## 2017-05-11 MED ORDER — ONDANSETRON HCL 4 MG PO TABS: 4.0000 mg | ORAL_TABLET | Freq: Four times a day (QID) | ORAL | Status: DC | PRN

## 2017-05-11 MED ORDER — ACETAMINOPHEN 325 MG PO TABS: 650.0000 mg | ORAL_TABLET | Freq: Four times a day (QID) | ORAL | Status: DC | PRN

## 2017-05-11 MED ORDER — SODIUM CHLORIDE 0.9 % IV SOLN
INTRAVENOUS | Status: DC
  Administered 2017-05-12 – 2017-05-13 (×2): via INTRAVENOUS

## 2017-05-11 MED ORDER — SODIUM CHLORIDE 0.9 % IV SOLN: Freq: Once | INTRAVENOUS | Status: DC

## 2017-05-11 MED ORDER — ALBUTEROL SULFATE (2.5 MG/3ML) 0.083% IN NEBU
2.5000 mg | INHALATION_SOLUTION | Freq: Four times a day (QID) | RESPIRATORY_TRACT | Status: DC
  Administered 2017-05-12: 2.5 mg via RESPIRATORY_TRACT
  Filled 2017-05-11 (×2): qty 3

## 2017-05-11 MED ORDER — ACETAMINOPHEN 650 MG RE SUPP: 650.0000 mg | Freq: Four times a day (QID) | RECTAL | Status: DC | PRN

## 2017-05-11 MED ORDER — ONDANSETRON HCL 4 MG/2ML IJ SOLN: 4.0000 mg | Freq: Four times a day (QID) | INTRAMUSCULAR | Status: DC | PRN

## 2017-05-11 MED ORDER — PANTOPRAZOLE SODIUM 40 MG IV SOLR
40.0000 mg | Freq: Once | INTRAVENOUS | Status: AC
  Administered 2017-05-11: 40 mg via INTRAVENOUS
  Filled 2017-05-11: qty 40

## 2017-05-11 NOTE — ED Notes (Signed)
Notified dr Clayborne Danamesner of Hgb 6.5

## 2017-05-11 NOTE — ED Notes (Signed)
Attempted to contact lab about pt add on, on hold for 15 mins with no response. [employer] system down in pt room, unable to print labels. Will continue to call main lab.

## 2017-05-11 NOTE — ED Notes (Signed)
ED Provider at bedside. 

## 2017-05-11 NOTE — ED Notes (Signed)
No response from main lab regarding pt add on labs despite sending message through the tube system. Unable to reach main lab to add on, re drew lipase and HFP and sent to main lab.

## 2017-05-11 NOTE — ED Notes (Signed)
Dr. Smith at the bedside.  

## 2017-05-11 NOTE — ED Notes (Signed)
Consent form signed by patient and witnessed by this nurse. Copy placed in medical records original consent placed by the bedside.

## 2017-05-11 NOTE — H&P (Addendum)
History and Physical    Diana Mcdowell ZOX:[id number] DOB: 10-Jul-1976 DOA: 05/11/2017  Referring MD/NP/PA: Dr. Eudelia Bunch PCP: Patient, No Pcp Per  Patient coming from: Home   Chief Complaint: Abdominal pain  HPI: Diana Mcdowell is a 41 y.o. female with medical history significant of anemia and headaches, who presents for epigastric abdominal pain over the last week. Patient describes pain as a constant dull. Patient admits to frequent use of BC and naproxen to treat headache symptoms for months to years. She has had associated symptoms of generalized fatigue, nausea, shortness of breath, and dark stools. Patient denies any significant cough, loss of consciousness, chest pain, palpitations, dysuria, fever, or chills. Patient notes being very anxious about being away from her children since the death of one of her children back in 2016 and initially wanted to not be admitted into the hospital. She reports she doesn't know how she does not sleep given the fact that she normally takes Aleve PM to sleep at night.  ED Course: Upon admission to the emergency department patient was seen to be afebrile with vital signs relatively within normal limits. Labs revealed a hemoglobin 6.5 with low MCV/ MCH, platelet count 579. Patient was given 40 mg Protonix and 1 unit of packed red blood cells while in the ED.  Review of Systems: As per HPI otherwise 10 point review of systems negative.   Past Medical History:  Diagnosis Date  . Anemia   . Migraines     Past Surgical History:  Procedure Laterality Date  . TUBAL LIGATION       reports that she has been smoking Cigarettes.  She has been smoking about 0.50 packs per day. She has never used smokeless tobacco. She reports that she does not drink alcohol or use drugs.  No Known Allergies  Family History  Problem Relation Age of Onset  . Hypertension Mother   . Diabetes Mother   . Cancer Maternal Grandfather   . Diabetes Paternal Grandmother     Prior  to Admission medications   Medication Sig Start Date End Date Taking? Authorizing Provider  aspirin EC 325 MG tablet Take 325 mg by mouth daily as needed (for headaches).   Yes [provider]  doxylamine, Sleep, (UNISOM) 25 MG tablet Take 25 mg by mouth at bedtime as needed for sleep.   Yes [provider]  naproxen sodium (ANAPROX) 220 MG tablet Take 220-440 mg by mouth 2 (two) times daily as needed (pain or cramping).    Yes [provider]    Physical Exam: Constitutional: Middle-aged female NAD, calm, comfortable Vitals:   05/11/17 2045 05/11/17 2100 05/11/17 2115 05/11/17 2200  BP: (!) 117/99 (!) 142/91 (!) 143/93 (!) 131/93  Pulse: 97 66 69 62  Resp: 13 13 16 11   Temp:      TempSrc:      SpO2:  100% 100% 100%  Weight:      Height:       Eyes: PERRL, lids and conjunctivae normal ENMT: Mucous membranes are dry. Posterior pharynx clear of any exudate or lesions.Normal dentition.  Neck: normal, supple, no masses, no thyromegaly Respiratory: clear to auscultation bilaterally, no wheezing, no crackles. Normal respiratory effort. No accessory muscle use.  Cardiovascular: Regular rate and rhythm, no murmurs / rubs / gallops. No extremity edema. 2+ pedal pulses. No carotid bruits.  Abdomen: Mild epigastric tenderness, no masses palpated. No hepatosplenomegaly. Bowel sounds positive.  Musculoskeletal: no clubbing / cyanosis. No joint deformity upper and lower  extremities. Good ROM, no contractures. Normal muscle tone.  Skin: no rashes, lesions, ulcers. No induration Neurologic: CN 2-12 grossly intact. Sensation intact, DTR normal. Strength 5/5 in all 4.  Psychiatric: Normal judgment and insight. Alert and oriented x 3. Anxious mood.     Labs on Admission: I have personally reviewed following labs and imaging studies  CBC:  Recent Labs Lab 05/11/17 1555  WBC 7.0  HGB 6.5*  HCT 23.3*  MCV 62.0*  PLT 579*   Basic Metabolic Panel:  Recent Labs Lab  05/11/17 1555  NA 134*  K 4.0  CL 106  CO2 22  GLUCOSE 92  BUN 10  CREATININE 0.73  CALCIUM 9.4   GFR: Estimated Creatinine Clearance: 79.9 mL/min (by C-G formula based on SCr of 0.73 mg/dL). Liver Function Tests:  Recent Labs Lab 05/11/17 1555  AST 26  ALT 11*  ALKPHOS 32*  BILITOT 0.2*  PROT 7.2  ALBUMIN 4.1    Recent Labs Lab 05/11/17 1555  LIPASE 20   No results for input(s): AMMONIA in the last 168 hours. Coagulation Profile: No results for input(s): INR, PROTIME in the last 168 hours. Cardiac Enzymes: No results for input(s): CKTOTAL, CKMB, CKMBINDEX, TROPONINI in the last 168 hours. BNP (last 3 results) No results for input(s): PROBNP in the last 8760 hours. HbA1C: No results for input(s): HGBA1C in the last 72 hours. CBG: No results for input(s): GLUCAP in the last 168 hours. Lipid Profile: No results for input(s): CHOL, HDL, LDLCALC, TRIG, CHOLHDL, LDLDIRECT in the last 72 hours. Thyroid Function Tests: No results for input(s): TSH, T4TOTAL, FREET4, T3FREE, THYROIDAB in the last 72 hours. Anemia Panel: No results for input(s): VITAMINB12, FOLATE, FERRITIN, TIBC, IRON, RETICCTPCT in the last 72 hours. Urine analysis:    Component Value Date/Time   COLORURINE YELLOW 05/06/2017 0845   APPEARANCEUR CLEAR 05/06/2017 0845   LABSPEC 1.029 05/06/2017 0845   PHURINE 5.0 05/06/2017 0845   GLUCOSEU NEGATIVE 05/06/2017 0845   HGBUR LARGE (A) 05/06/2017 0845   BILIRUBINUR NEGATIVE 05/06/2017 0845   KETONESUR NEGATIVE 05/06/2017 0845   PROTEINUR 30 (A) 05/06/2017 0845   UROBILINOGEN 1.0 05/06/2008 2236   NITRITE NEGATIVE 05/06/2017 0845   LEUKOCYTESUR NEGATIVE 05/06/2017 0845   Sepsis Labs: Recent Results (from the past 240 hour(s))  Wet prep, genital     Status: Abnormal   Collection Time: 05/06/17 10:00 AM  Result Value Ref Range Status   Yeast Wet Prep HPF POC NONE SEEN NONE SEEN Final   Trich, Wet Prep NONE SEEN NONE SEEN Final   Clue Cells Wet  Prep HPF POC NONE SEEN NONE SEEN Final   WBC, Wet Prep HPF POC FEW (A) NONE SEEN Final    Comment: FEW BACTERIA SEEN   Sperm NONE SEEN  Final     Radiological Exams on Admission: Dg Chest 2 View  Result Date: 05/11/2017 CLINICAL DATA:  Chest pain EXAM: CHEST  2 VIEW COMPARISON:  10/05/2015 FINDINGS: Midline trachea. Borderline cardiomegaly. Mediastinal contours otherwise within normal limits. No pleural effusion or pneumothorax. Clear lungs. IMPRESSION: Borderline cardiomegaly, without acute disease. Electronically Signed   By: Jeronimo Greaves M.D.   On: 05/11/2017 16:41    EKG: Independently reviewed. Normal sinus rhythm  Assessment/Plan Suspected GI bleed 2/2 NSAID use: Acute. Patient reports chronic use of naproxen and BC Goody powders on a daily basis for headaches. She presents with abdominal pain found to have hemoglobin of 6.5 and reports dark stools. She received 1 unit of packed  red blood cells while in the ED. - Admit to MedSurg bed - NPO - Follow-up stool guaiac - Protonix drip - IV fluids of normal saline at 75 mL per hour - Will need to consult to gastroenterology in a.m.  Epigastric abdominal pain: Acute. Likely related to patient's NSAID use which puts patient at risk for gastric ulcers. - As seen above  Acute blood loss with microcytic microchromic anemia: Patient's initial hemoglobin noted to be 6.5 low MCV and MCH. The patient had received the  blood transfusion prior to any additional blood work could be obtained including iron studies. - Check H&H every 4 hours - Transfuse if hemoglobin drops below 7  Anxiety/insomnia - Ativan IV prn sleep  DVT prophylaxis: None  Code Status: Full Family Communication: No family present at bedside Disposition Plan: Likely discharge home in 1-2 days  Consults called: none Admission status: Inpatient  Clydie Braunondell A Smith MD [facility] Pager [phone]  If 7PM-7AM, please contact night-coverage [email or web address] Password  [email or web address]  05/11/2017, 10:44 PM

## 2017-05-11 NOTE — ED Triage Notes (Addendum)
Pt c/o chest pain that started Wednesday, states was unable to breath last night, "Gasping for air" tearful at triage -- [facility] for U[facility] symptoms. Pt states pain is "dull" in middle of chest -- diaphoretic with pain. Pain 7/10 at present.

## 2017-05-11 NOTE — ED Notes (Signed)
Pt visibly tearful and reports she is "upset and stressed because I don't want to be away from my kids". This nurse spoke with pt and assured her her children are able to come visit whenever and stay as long as they like so they can be with her. Pt given phone to dial her mother.

## 2017-05-11 NOTE — ED Notes (Signed)
Dr. Eudelia Bunchardama aware of pt hgb.

## 2017-05-11 NOTE — ED Provider Notes (Signed)
[facility]-EMERGENCY DEPT Provider Note   CSN: [id number] Arrival date & time: 05/11/17  1547     History   Chief Complaint Chief Complaint  Patient presents with  . Chest Pain    HPI Diana Mcdowell is a 41 y.o. female.  The history is provided by the patient.  Chest Pain   This is a new problem. Episode onset: 5 days. The problem occurs constantly. The problem has been gradually worsening. The pain is associated with exertion. The pain is present in the substernal region. The pain is moderate. The quality of the pain is described as pressure-like. The pain does not radiate. Associated symptoms include malaise/fatigue, nausea and shortness of breath. Pertinent negatives include no cough, no hemoptysis, no lower extremity edema and no vomiting. Risk factors include smoking/tobacco exposure.  Pertinent negatives for past medical history include no CAD, no CHF, no diabetes, no DVT, no hyperlipidemia, no hypertension, no MI, no PE, no sickle cell disease and no TIA.   Reported several yrs of intermittent epigastric pain that she treated with NSAIDs and associated intermittent melena.  Also reports dysfunctional uterine bleeding; bleeding up to 33 days at one time.   Past Medical History:  Diagnosis Date  . Anemia   . Migraines     There are no active problems to display for this patient.   Past Surgical History:  Procedure Laterality Date  . TUBAL LIGATION      OB History    Gravida Para Term Preterm AB Living   6 5 4 1 1      SAB TAB Ectopic Multiple Live Births   1               Home Medications    Prior to Admission medications   Medication Sig Start Date End Date Taking? Authorizing Provider  aspirin EC 325 MG tablet Take 325 mg by mouth daily as needed (for headaches).   Yes [provider]  doxylamine, Sleep, (UNISOM) 25 MG tablet Take 25 mg by mouth at bedtime as needed for sleep.   Yes [provider]  naproxen sodium (ANAPROX) 220 MG tablet Take  220-440 mg by mouth 2 (two) times daily as needed (pain or cramping).    Yes [provider]    Family History Family History  Problem Relation Age of Onset  . Hypertension Mother   . Diabetes Mother   . Cancer Maternal Grandfather   . Diabetes Paternal Grandmother     Social History Social History  Substance Use Topics  . Smoking status: Current Every Day Smoker    Packs/day: 0.50    Types: Cigarettes  . Smokeless tobacco: Never Used  . Alcohol use No     Allergies   Patient has no known allergies.   Review of Systems Review of Systems  Constitutional: Positive for malaise/fatigue.  Respiratory: Positive for shortness of breath. Negative for cough and hemoptysis.   Cardiovascular: Positive for chest pain.  Gastrointestinal: Positive for nausea. Negative for vomiting.  All other systems are reviewed and are negative for acute change except as noted in the HPI    Physical Exam Updated Vital Signs BP (!) 134/91   Pulse 70   Temp 98.7 F (37.1 C) (Oral)   Resp 13   Ht 5\' 4"  (1.626 m)   Wt 59.9 kg (132 lb)   LMP 05/06/2017   SpO2 100%   BMI 22.66 kg/m   Physical Exam  Constitutional: She is oriented to person, place, and time.  She appears well-developed and well-nourished. No distress.  HENT:  Head: Normocephalic and atraumatic.  Nose: Nose normal.  Eyes: Conjunctivae and EOM are normal. Pupils are equal, round, and reactive to light. Right eye exhibits no discharge. Left eye exhibits no discharge. No scleral icterus.  Neck: Normal range of motion. Neck supple.  Cardiovascular: Normal rate and regular rhythm.  Exam reveals no gallop and no friction rub.   No murmur heard. Pulmonary/Chest: Effort normal and breath sounds normal. No stridor. No respiratory distress. She has no rales.  Abdominal: Soft. She exhibits no distension. There is tenderness in the epigastric area. There is no rigidity, no rebound and no guarding.  Musculoskeletal: She exhibits  no edema or tenderness.  Neurological: She is alert and oriented to person, place, and time.  Skin: Skin is warm and dry. No rash noted. She is not diaphoretic. No erythema.  Psychiatric: She has a normal mood and affect.  Vitals reviewed.    ED Treatments / Results  Labs (all labs ordered are listed, but only abnormal results are displayed) Labs Reviewed  BASIC METABOLIC PANEL - Abnormal; Notable for the following:       Result Value   Sodium 134 (*)    All other components within normal limits  CBC - Abnormal; Notable for the following:    RBC 3.76 (*)    Hemoglobin 6.5 (*)    HCT 23.3 (*)    MCV 62.0 (*)    MCH 17.3 (*)    MCHC 27.9 (*)    RDW 21.1 (*)    Platelets 579 (*)    All other components within normal limits  HEPATIC FUNCTION PANEL - Abnormal; Notable for the following:    ALT 11 (*)    Alkaline Phosphatase 32 (*)    Total Bilirubin 0.2 (*)    Bilirubin, Direct <0.1 (*)    All other components within normal limits  LIPASE, BLOOD  I-STAT TROPOININ, ED  POC OCCULT BLOOD, ED  PREPARE RBC (CROSSMATCH)  TYPE AND SCREEN  ABO/RH    EKG  EKG Interpretation  Date/Time:  Saturday May 11 2017 15:56:45 EDT Ventricular Rate:  81 PR Interval:  154 QRS Duration: 82 QT Interval:  390 QTC Calculation: 453 R Axis:   -21 Text Interpretation:  Normal sinus rhythm Normal ECG no stemi Confirmed by Drema Pry [id number]) on 05/11/2017 4:58:43 PM       Radiology Dg Chest 2 View  Result Date: 05/11/2017 CLINICAL DATA:  Chest pain EXAM: CHEST  2 VIEW COMPARISON:  10/05/2015 FINDINGS: Midline trachea. Borderline cardiomegaly. Mediastinal contours otherwise within normal limits. No pleural effusion or pneumothorax. Clear lungs. IMPRESSION: Borderline cardiomegaly, without acute disease. Electronically Signed   By: Jeronimo Greaves M.D.   On: 05/11/2017 16:41    Procedures Procedures (including critical care time)  CRITICAL CARE Performed by: Amadeo Garnet Cardama Total  critical care time: Critical care time was exclusive of separately billable procedures and treating other patients. Critical care was necessary to treat or prevent imminent or life-threatening deterioration. Critical care was time spent personally by me on the following activities: development of treatment plan with patient and/or surrogate as well as nursing, discussions with consultants, evaluation of patient's response to treatment, examination of patient, obtaining history from patient or surrogate, ordering and performing treatments and interventions, ordering and review of laboratory studies, ordering and review of radiographic studies, pulse oximetry and re-evaluation of patient's condition.   Medications Ordered in ED Medications  0.9 %  sodium chloride infusion (not administered)  pantoprazole (PROTONIX) injection 40 mg (not administered)     Initial Impression / Assessment and Plan / ED Course  I have reviewed the triage vital signs and the nursing notes.  Pertinent labs & imaging results that were available during my care of the patient were reviewed by me and considered in my medical decision making (see chart for details).     Workup consistent with symptomatic anemia. Source likely bleeding peptic ulcer, given the reported epigastric abdominal pain and intermittent melanotic stools in the setting of NSAID use.  Protonix ordered. First unit of packed red blood cells transfused in the emergency department.  Patient admitted to hospitalist for further workup and management.  Final Clinical Impressions(s) / ED Diagnoses   Final diagnoses:  Symptomatic anemia  Melena  Atypical chest pain      Cardama, Amadeo Garnet, MD 05/11/17 2247

## 2017-05-11 NOTE — ED Notes (Signed)
Pt tearful and visibly angry. Pt states "I don't want to be here and y'all can't keep me here. I know you can't. I have to be with my kids." This nurse explained to pt she is able to leave when she pleases however this nurse encouraged pt to stay in order to be treated for her CC. Pt remained agitated and refused any comfort measures. Offered pt something to eat or drink per Dr. Eudelia Bunchardama, pt refused despite stating she is hungry/thirsty.

## 2017-05-12 ENCOUNTER — Inpatient Hospital Stay (HOSPITAL_COMMUNITY): Payer: Self-pay

## 2017-05-12 ENCOUNTER — Encounter (HOSPITAL_COMMUNITY): Payer: Self-pay | Admitting: Physician Assistant

## 2017-05-12 DIAGNOSIS — R1013 Epigastric pain: Secondary | ICD-10-CM

## 2017-05-12 DIAGNOSIS — D62 Acute posthemorrhagic anemia: Secondary | ICD-10-CM | POA: Diagnosis present

## 2017-05-12 DIAGNOSIS — D508 Other iron deficiency anemias: Secondary | ICD-10-CM

## 2017-05-12 DIAGNOSIS — N921 Excessive and frequent menstruation with irregular cycle: Principal | ICD-10-CM

## 2017-05-12 DIAGNOSIS — K921 Melena: Secondary | ICD-10-CM

## 2017-05-12 DIAGNOSIS — G8929 Other chronic pain: Secondary | ICD-10-CM

## 2017-05-12 DIAGNOSIS — D649 Anemia, unspecified: Secondary | ICD-10-CM

## 2017-05-12 DIAGNOSIS — G4700 Insomnia, unspecified: Secondary | ICD-10-CM | POA: Diagnosis present

## 2017-05-12 LAB — HEMOGLOBIN AND HEMATOCRIT, BLOOD
HCT: 26.8 % — ABNORMAL LOW (ref 36.0–46.0)
HEMATOCRIT: 26.5 % — AB (ref 36.0–46.0)
HEMATOCRIT: 26.8 % — AB (ref 36.0–46.0)
HEMOGLOBIN: 7.7 g/dL — AB (ref 12.0–15.0)
Hemoglobin: 7.5 g/dL — ABNORMAL LOW (ref 12.0–15.0)
Hemoglobin: 7.6 g/dL — ABNORMAL LOW (ref 12.0–15.0)

## 2017-05-12 LAB — BASIC METABOLIC PANEL
Anion gap: 8 (ref 5–15)
BUN: 8 mg/dL (ref 6–20)
CHLORIDE: 106 mmol/L (ref 101–111)
CO2: 22 mmol/L (ref 22–32)
CREATININE: 0.61 mg/dL (ref 0.44–1.00)
Calcium: 8.7 mg/dL — ABNORMAL LOW (ref 8.9–10.3)
GFR calc Af Amer: >60 mL/min (ref >60)
GFR calc non Af Amer: >60 mL/min (ref >60)
Glucose, Bld: 80 mg/dL (ref 65–99)
Potassium: 3.4 mmol/L — ABNORMAL LOW (ref 3.5–5.1)
Sodium: 136 mmol/L (ref 135–145)

## 2017-05-12 LAB — PREPARE RBC (CROSSMATCH)

## 2017-05-12 LAB — PREGNANCY, URINE: PREG TEST UR: NEGATIVE

## 2017-05-12 MED ORDER — PANTOPRAZOLE SODIUM 40 MG IV SOLR
40.0000 mg | Freq: Two times a day (BID) | INTRAVENOUS | Status: DC
  Administered 2017-05-12 – 2017-05-13 (×2): 40 mg via INTRAVENOUS
  Filled 2017-05-12 (×2): qty 40

## 2017-05-12 MED ORDER — SODIUM CHLORIDE 0.9 % IV SOLN: Freq: Once | INTRAVENOUS | Status: DC

## 2017-05-12 MED ORDER — ALBUTEROL SULFATE (2.5 MG/3ML) 0.083% IN NEBU: 2.5000 mg | INHALATION_SOLUTION | Freq: Four times a day (QID) | RESPIRATORY_TRACT | Status: DC | PRN

## 2017-05-12 MED ORDER — LORAZEPAM 2 MG/ML IJ SOLN
0.5000 mg | Freq: Once | INTRAMUSCULAR | Status: AC
  Administered 2017-05-12: 0.5 mg via INTRAVENOUS
  Filled 2017-05-12: qty 1

## 2017-05-12 NOTE — Consult Note (Signed)
                                                                           [facility] Consult: 10:13 AM 05/12/2017  LOS: 1 day    Referring Provider: Dr Elgergawy  Primary Care Physician:  Patient, No Pcp Per Primary Gastroenterologist:  unassigned     Reason for Consultation:  Anemia.  Epigastric pain.      HPI: Diana Mcdowell is a 40 y.o. female.  PMH migraine type headaches and anemia (Hgb of 8.5 and MCV of 66 in 10/2015, at that time was prescribed po Iron).  [id number].  S/p BTL 2002.    Uses lots of BCs and Naproxen for HA.  Long standing menorrhagia, 2 periods per month.  First last 7 to 9 days, passes large clots of blood,can go through 24 pads in 24 hours.  Second period mid to later in month also with heavy bleeding, lasts 3 to 5 days.  Depressed and worried since murder of 19 y/o son in 09/2016.    Uses 6 to 8 Aleve daily and 3 to 4 BC powders per week for headaches.   For 2 weeks having epigastric/xyphoid pain, 6/10 severity: sharp, stabbing.  Not taking anything for it other than the usual meds for headache.  For several months having intermittent dark sometimes red blood with stools.  Last Friday, 8 days ago, stool black and tarry, but then resumed dark brown.  The following Tuesday started fatigue, DOE, tachycardia with minor activity, some dizziness.  No fever or chills.  Appetite reduced, no n/v.  PO does not make sxs worse.      Hgb 6.5 >> 1 unit PRBC >> 7.7.  MCV 62.  FOBT negative.  Potassium of 3.4 but normal renal function.  CXR with borderline CM.     Seen in clinic on 6/4 for epigastric pain and urinary sxs.  U/A with TNTC RBCs but no grossly bloody. Treated with Bactrim for UTI.  Also reported 9 day menses starting 5/23, normal for her.  Woke up with vaginal bleeding on 6/4, apparently not unusual.  Long time since last gyn exam.   No ETOH and no hx snorting or  injecting drugs.  Smokes 7 cigs/day.   FM hx neg for ulcers, colorectal cancer or disease, liver disease.     Past Medical History:  Diagnosis Date  . Anemia   . Migraines     Past Surgical History:  Procedure Laterality Date  . TUBAL LIGATION  2002    Prior to Admission medications   Medication Sig Start Date End Date Taking? Authorizing Provider  aspirin EC 325 MG tablet Take 325 mg by mouth daily as needed (for headaches).   Yes [provider]  doxylamine, Sleep, (UNISOM) 25 MG tablet Take 25 mg by mouth at bedtime as needed for sleep.   Yes [provider]  naproxen sodium (ANAPROX) 220 MG tablet Take 220-440 mg by mouth 2 (two) times daily as needed (pain or cramping).    Yes [provider]    Scheduled Meds: . pantoprazole  40 mg Intravenous Q12H   Infusions: . sodium chloride    . sodium chloride 75 mL/hr at 05/12/17   0035   PRN Meds: acetaminophen **OR** acetaminophen, albuterol, LORazepam, ondansetron **OR** ondansetron (ZOFRAN) IV   Allergies as of 05/11/2017  . (No Known Allergies)    Family History  Problem Relation Age of Onset  . Hypertension Mother   . Diabetes Mother   . Cancer Maternal Grandfather   . Diabetes Paternal Grandmother     Social History   Social History  . Marital status: Single    Spouse name: N/A  . Number of children: N/A  . 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 Smoking status: Current Every Day Smoker    Packs/day: 0.50    Types: Cigarettes  . Smokeless tobacco: Never Used  . Alcohol use No  . Drug use: No  . Sexual activity: Not on file   Other Topics Concern  . Not on file   Social History Narrative  . No narrative on file    REVIEW OF SYSTEMS: Constitutional:  Per HPI ENT:  No nose bleeds.  Dental pain and tooth decay are a problem Pulm:  Per HPI.  Usually no SOB CV:  No palpitations, no LE edema.  GU:  No hematuria,  no frequency GI:  Per HPI Heme:  No unusual bleeding other than GYN and    Transfusions:  None before now Neuro:  No headaches, no peripheral tingling or numbness Derm:  Knot on base of neck, has gotten larger over the years.  No itching, no rash or sores.   Endocrine:  No sweats or chills.  No polyuria or dysuria Immunization:  Not queried Travel:  None beyond local counties in last few months.    PHYSICAL EXAM: Vital signs in last 24 hours: Vitals:   05/12/17 0925 05/12/17 1000  BP: 119/81 126/80  Pulse: 68 69  Resp: 18 18  Temp: 98.6 F (37 C) 99.6 F (37.6 C)   Wt Readings from Last 3 Encounters:  05/12/17 60.1 kg (132 lb 7.9 oz)  05/06/17 59.9 kg (132 lb)    General: pleasant, comfortable.  Looks well.   Head:  No asymmetry or swelling  Eyes:  No icterus or pallor Ears:  Not HOH  Nose:  No congestion or discharge Mouth:  Moist and clear oral MM.  Tongue midline.  Tooth decay and some missing.  Neck:  Benign feeling mass at base of neck, overlying spine, mobile and not tender (? sebbaceous cyst vs lipoma).  No JVD, no TMG Lungs:  Clear bil.  No labored breathing or cough Heart: RRR.  No MRG.  S1, S2 present.   Abdomen:  Soft, ND.  No hernias, masses, HSM, Bruits.  Tender without guarding/rebound in upper abdomen.  Rectal: no mass.  No stool to sample   Musc/Skeltl: no joint swelling Extremities:  No CCE  Neurologic:  Alert.  Oriented x 3.  Strength full.  Able to walk unaided to toilet without weakness.  Skin:  No rash, no sores.   Soft, NT mass at cervical spine level as per neck exam Nodes:  No cervical adenopathy.   Psych:  Tearful at times.  Calm.  Appropriate.    Intake/Output from previous day: 06/09 0701 - 06/10 0700 In: 1007.9 [I.V.:337.9; Blood:670] Out: 0  Intake/Output this shift: Total I/O In: 240 [P.O.:240] Out: 0   LAB RESULTS:  Recent Labs  05/11/17 1555 05/12/17 0419 05/12/17 0747  WBC 7.0  --   --   HGB 6.5* 7.6*    7.5* 7.7*  HCT  23.3* 26.8*  26.5* 26.8*  PLT 579*  --   --    BMET Lab Results  Component Value Date   NA 136 05/12/2017   NA 134 (L) 05/11/2017   NA 136 10/05/2015   K 3.4 (L) 05/12/2017   K 4.0 05/11/2017   K 3.6 10/05/2015   CL 106 05/12/2017   CL 106 05/11/2017   CL 105 10/05/2015   CO2 22 05/12/2017   CO2 22 05/11/2017   CO2 22 10/05/2015   GLUCOSE 80 05/12/2017   GLUCOSE 92 05/11/2017   GLUCOSE 87 10/05/2015   BUN 8 05/12/2017   BUN 10 05/11/2017   BUN 9 10/05/2015   CREATININE 0.61 05/12/2017   CREATININE 0.73 05/11/2017   CREATININE 0.76 10/05/2015   CALCIUM 8.7 (L) 05/12/2017   CALCIUM 9.4 05/11/2017   CALCIUM 9.4 10/05/2015   LFT  Recent Labs  05/11/17 1555  PROT 7.2  ALBUMIN 4.1  AST 26  ALT 11*  ALKPHOS 32*  BILITOT 0.2*  BILIDIR <0.1*  IBILI NOT CALCULATED   PT/INR No results found for: INR, PROTIME Hepatitis Panel No results for input(s): HEPBSAG, HCVAB, HEPAIGM, HEPBIGM in the last 72 hours. C-Diff No components found for: CDIFF Lipase     Component Value Date/Time   LIPASE 20 05/11/2017 1555    Drugs of Abuse  No results found for: LABOPIA, COCAINSCRNUR, LABBENZ, AMPHETMU, THCU, LABBARB   RADIOLOGY STUDIES: Dg Chest 2 View  Result Date: 05/11/2017 CLINICAL DATA:  Chest pain EXAM: CHEST  2 VIEW COMPARISON:  10/05/2015 FINDINGS: Midline trachea. Borderline cardiomegaly. Mediastinal contours otherwise within normal limits. No pleural effusion or pneumothorax. Clear lungs. IMPRESSION: Borderline cardiomegaly, without acute disease. Electronically Signed   By: Kyle  Talbot M.D.   On: 05/11/2017 16:41     IMPRESSION:   *  Microcytic anemia.  From menorrhagia and suspect superimposed GI blood loss with intermittent black, bloody stools.    *  Migraine type headaches.  Needs better strategy than NSAIDs, ASA for controlling pain   *  Menorrhagia, long standing.  Needs evaluation.  Last pelvic exam ~ 2014, not in [place].     PLAN:     *  CBC in AM.   EGD in AM.    *  Switch to BID Protonix.    *  Would discharge on po Iron.  Consider iron infusion while inpt.  Needs Gyn referral, ? Pelvic ultrasound to assess for fibroids.     Sarah Gribbin  05/12/2017, 10:13 AM Pager: [phone]   Attending physician's note   I have taken a history, examined the patient and reviewed the chart. I agree with the Advanced Practitioner's note, impression and recommendations. 40-year-old female with chronic anemia, frequent excessive use of nsaids admitted with symptomatic anemia. She complains of epigastric abdominal pain. Hemoglobin responded appropriately to blood transfusion. No bowel movement since yesterday. Anemia likely multifactorial, we'll need to exclude GI blood loss even though fecal Hemoccult negative and also evaluate for possible gastroduodenal ulcer disease in the setting of chronic NSAID use. Schedule for EGD tomorrow The risks and benefits as well as alternatives of endoscopic procedure(s) have been discussed and reviewed. All questions answered. The patient agrees to proceed. Nothing by mouth after midnight PPI twice daily  K Veena Clevie Prout, MD [phone] Mon-Fri 8a-5p [phone] after 5p, weekends, holidays   

## 2017-05-12 NOTE — Progress Notes (Signed)
New Admission Note:  Arrival Method: By bed from ED around 0000 Mental Orientation: Alert and oriented Telemetry: [address] 28, CCMD notified Assessment: Completed Skin: Completed, refer to flowsheets IV: Right AC and Left AC Pain: Denies Tubes: None Safety Measures: Safety Fall Prevention Plan was given, discussed and signed. Admission: Completed [address] Orientation: Patient has been orientated to the room, unit and the staff. Family: None  Orders have been reviewed and implemented. Will continue to monitor the patient. Call light has been placed within reach.   Alfonse Rashristy Seferina Brokaw, RN  Phone Number: [phone]

## 2017-05-12 NOTE — Progress Notes (Signed)
PROGRESS NOTE                                                                                                                                                                                                             Patient Demographics:    Diana Mcdowell, is a 41 y.o. female, DOB - November 06, 1976, ZOX:[id number]  Admit date - 05/11/2017   Admitting Physician Clydie Braun, MD  Outpatient Primary MD for the patient is Patient, No Pcp Per  LOS - 1  Chief Complaint  Patient presents with  . Chest Pain       Brief Narrative   41 y.o. female with medical history significant of anemia and headaches, who presents for epigastric abdominal pain over the last week, workup significant anemia with hemoglobin of 6.5   Subjective:    Diana Mcdowell denies no headache, no chest pain, no shortness of breath , or palpitations .    Assessment  & Plan :    Principal Problem:   GI bleed Active Problems:   Acute blood loss anemia   Insomnia  Symptomatic anemia - This is iron deficiency anemia secondary to chronic blood loss  - Blood loss may be related to upper GI bleed gastritis/gastric ulcer in the setting of AV chronic NSAIDs use, GI was consulted, and plan for EGD in a.m., continue with PPI. - As well may be in the setting of metrorrhagia. - Transfused 1 unit PRBC overnight, repeat hemoglobin is 7.7, she will be transfused another unit today - Very likely will need IV iron during hospital stay, and by mouth iron supplement on discharge, oriented transfused blood, so doing anemia panel currently want be helpful.  Metrorrhagia - Discussed with GYN Dr. Penne Lash, will obtain pregnancy test initially, then will obtain transvaginal ultrasound for further evaluation, and he would arrange for outpatient follow-up, will discuss further management with GYN after ultrasound is done.  Tobacco abuse - She was counseled  Epigastric abdominal pain - Likely related to  gastritis versus gastric ulcers from NSAIDs use   Code Status : Full  Family Communication  : aunt at bedside  Disposition Plan  : home when stable  Consults  :  GI  Procedures  : none  DVT Prophylaxis  :  SCDs  Lab Results  Component Value Date   PLT 579 (H) 05/11/2017  Antibiotics  :    Anti-infectives    None        Objective:   Vitals:   05/12/17 0917 05/12/17 0925 05/12/17 1000 05/12/17 1026  BP:  119/81 126/80 123/79  Pulse:  68 69 71  Resp:  18 18 18   Temp:  98.6 F (37 C) 99.6 F (37.6 C) 98.9 F (37.2 C)  TempSrc:  Oral Oral Oral  SpO2: 100% 100% 100% 100%  Weight:      Height:        Wt Readings from Last 3 Encounters:  05/12/17 60.1 kg (132 lb 7.9 oz)  05/06/17 59.9 kg (132 lb)     Intake/Output Summary (Last 24 hours) at 05/12/17 1244 Last data filed at 05/12/17 1012  Gross per 24 hour  Intake          1247.92 ml  Output                0 ml  Net          1247.92 ml     Physical Exam  Awake Alert, Oriented X 3,  Supple Neck,No JVD,  Symmetrical Chest wall movement, Good air movement bilaterally, CTAB RRR,No Gallops,Rubs or new Murmurs, No Parasternal Heave +ve B.Sounds, Abd Soft, has some epigastric tenderness, No rebound - guarding or rigidity. No Cyanosis, Clubbing or edema, No new Rash or bruise      Data Review:    CBC  Recent Labs Lab 05/11/17 1555 05/12/17 0419 05/12/17 0747  WBC 7.0  --   --   HGB 6.5* 7.6*  7.5* 7.7*  HCT 23.3* 26.8*  26.5* 26.8*  PLT 579*  --   --   MCV 62.0*  --   --   MCH 17.3*  --   --   MCHC 27.9*  --   --   RDW 21.1*  --   --     Chemistries   Recent Labs Lab 05/11/17 1555 05/12/17 0419  NA 134* 136  K 4.0 3.4*  CL 106 106  CO2 22 22  GLUCOSE 92 80  BUN 10 8  CREATININE 0.73 0.61  CALCIUM 9.4 8.7*  AST 26  --   ALT 11*  --   ALKPHOS 32*  --   BILITOT 0.2*  --     ------------------------------------------------------------------------------------------------------------------ No results for input(s): CHOL, HDL, LDLCALC, TRIG, CHOLHDL, LDLDIRECT in the last 72 hours.  No results found for: HGBA1C ------------------------------------------------------------------------------------------------------------------ No results for input(s): TSH, T4TOTAL, T3FREE, THYROIDAB in the last 72 hours.  Invalid input(s): FREET3 ------------------------------------------------------------------------------------------------------------------ No results for input(s): VITAMINB12, FOLATE, FERRITIN, TIBC, IRON, RETICCTPCT in the last 72 hours.  Coagulation profile No results for input(s): INR, PROTIME in the last 168 hours.  No results for input(s): DDIMER in the last 72 hours.  Cardiac Enzymes No results for input(s): CKMB, TROPONINI, MYOGLOBIN in the last 168 hours.  Invalid input(s): CK ------------------------------------------------------------------------------------------------------------------ No results found for: BNP  Inpatient Medications  Scheduled Meds: . pantoprazole  40 mg Intravenous Q12H   Continuous Infusions: . sodium chloride    . sodium chloride 75 mL/hr at 05/12/17 0035   PRN Meds:.acetaminophen **OR** acetaminophen, albuterol, LORazepam, ondansetron **OR** ondansetron (ZOFRAN) IV  Micro Results Recent Results (from the past 240 hour(s))  Wet prep, genital     Status: Abnormal   Collection Time: 05/06/17 10:00 AM  Result Value Ref Range Status   Yeast Wet Prep HPF POC NONE SEEN NONE SEEN Final   Trich, Wet Prep NONE  SEEN NONE SEEN Final   Clue Cells Wet Prep HPF POC NONE SEEN NONE SEEN Final   WBC, Wet Prep HPF POC FEW (A) NONE SEEN Final    Comment: FEW BACTERIA SEEN   Sperm NONE SEEN  Final    Radiology Reports Dg Chest 2 View  Result Date: 05/11/2017 CLINICAL DATA:  Chest pain EXAM: CHEST  2 VIEW COMPARISON:   10/05/2015 FINDINGS: Midline trachea. Borderline cardiomegaly. Mediastinal contours otherwise within normal limits. No pleural effusion or pneumothorax. Clear lungs. IMPRESSION: Borderline cardiomegaly, without acute disease. Electronically Signed   By: Jeronimo GreavesKyle  Talbot M.D.   On: 05/11/2017 16:41     Luvinia Lucy M.D on 05/12/2017 at 12:44 PM  Between 7am to 7pm - Pager - [phone]  After 7pm go to [email or web address] - password [email or web address]  [facility] -  Office  [phone]

## 2017-05-13 ENCOUNTER — Other Ambulatory Visit: Payer: Self-pay | Admitting: Internal Medicine

## 2017-05-13 ENCOUNTER — Inpatient Hospital Stay (HOSPITAL_COMMUNITY): Payer: Self-pay | Admitting: Certified Registered"

## 2017-05-13 ENCOUNTER — Encounter (HOSPITAL_COMMUNITY): Payer: Self-pay | Admitting: *Deleted

## 2017-05-13 ENCOUNTER — Encounter (HOSPITAL_COMMUNITY)
Admission: EM | Disposition: A | Discharge status: Home / Self Care | Payer: Self-pay | Source: Home / Self Care | Attending: Internal Medicine

## 2017-05-13 DIAGNOSIS — R51 Headache: Secondary | ICD-10-CM

## 2017-05-13 DIAGNOSIS — K254 Chronic or unspecified gastric ulcer with hemorrhage: Secondary | ICD-10-CM

## 2017-05-13 DIAGNOSIS — N921 Excessive and frequent menstruation with irregular cycle: Secondary | ICD-10-CM

## 2017-05-13 DIAGNOSIS — F4321 Adjustment disorder with depressed mood: Secondary | ICD-10-CM

## 2017-05-13 DIAGNOSIS — F4329 Adjustment disorder with other symptoms: Secondary | ICD-10-CM

## 2017-05-13 DIAGNOSIS — K921 Melena: Secondary | ICD-10-CM | POA: Diagnosis present

## 2017-05-13 DIAGNOSIS — D500 Iron deficiency anemia secondary to blood loss (chronic): Secondary | ICD-10-CM

## 2017-05-13 DIAGNOSIS — F4381 Prolonged grief disorder: Secondary | ICD-10-CM

## 2017-05-13 DIAGNOSIS — R519 Headache, unspecified: Secondary | ICD-10-CM

## 2017-05-13 DIAGNOSIS — G8929 Other chronic pain: Secondary | ICD-10-CM

## 2017-05-13 HISTORY — DX: Excessive and frequent menstruation with irregular cycle: N92.1

## 2017-05-13 HISTORY — DX: Other chronic pain: G89.29

## 2017-05-13 HISTORY — DX: Prolonged grief disorder: F43.81

## 2017-05-13 HISTORY — PX: ESOPHAGOGASTRODUODENOSCOPY: SHX5428

## 2017-05-13 HISTORY — DX: Adjustment disorder with other symptoms: F43.29

## 2017-05-13 HISTORY — DX: Headache, unspecified: R51.9

## 2017-05-13 LAB — BPAM RBC
Blood Product Expiration Date: 201806292359
Blood Product Expiration Date: 201806302359
ISSUE DATE / TIME: 201806100957
ISSUE DATE / TIME: 201806091812
UNIT TYPE AND RH: 5100
Unit Type and Rh: 5100

## 2017-05-13 LAB — TYPE AND SCREEN
ABO/RH(D): O POS
Antibody Screen: NEGATIVE
UNIT DIVISION: 0
Unit division: 0

## 2017-05-13 LAB — CBC
HCT: 30.9 % — ABNORMAL LOW (ref 36.0–46.0)
Hemoglobin: 9.1 g/dL — ABNORMAL LOW (ref 12.0–15.0)
MCH: 19.7 pg — AB (ref 26.0–34.0)
MCHC: 29.4 g/dL — AB (ref 30.0–36.0)
MCV: 65.9 fL — AB (ref 78.0–100.0)
Platelets: 553 10*3/uL — ABNORMAL HIGH (ref 150–400)
RBC: 4.62 MIL/uL (ref 3.87–5.11)
RDW: 24.4 % — ABNORMAL HIGH (ref 11.5–15.5)
WBC: 7.1 10*3/uL (ref 4.0–10.5)

## 2017-05-13 SURGERY — EGD (ESOPHAGOGASTRODUODENOSCOPY)
Anesthesia: Monitor Anesthesia Care

## 2017-05-13 MED ORDER — PROPOFOL 10 MG/ML IV BOLUS
INTRAVENOUS | Status: DC | PRN
  Administered 2017-05-13 (×2): 50 mg via INTRAVENOUS

## 2017-05-13 MED ORDER — LACTATED RINGERS IV SOLN
INTRAVENOUS | Status: DC
  Administered 2017-05-13: 13:00:00 via INTRAVENOUS

## 2017-05-13 MED ORDER — PANTOPRAZOLE SODIUM 40 MG PO TBEC
40.0000 mg | DELAYED_RELEASE_TABLET | Freq: Every day | ORAL | 1 refills | Status: DC
Start: 2017-05-14 — End: 2018-06-05

## 2017-05-13 MED ORDER — PROPOFOL 500 MG/50ML IV EMUL
INTRAVENOUS | Status: DC | PRN
  Administered 2017-05-13: 120 ug/kg/min via INTRAVENOUS

## 2017-05-13 MED ORDER — FERROUS SULFATE 325 (65 FE) MG PO TABS: 325.0000 mg | ORAL_TABLET | Freq: Two times a day (BID) | ORAL | 0 refills | Status: AC

## 2017-05-13 MED ORDER — FERROUS SULFATE 325 (65 FE) MG PO TABS
325.0000 mg | ORAL_TABLET | Freq: Two times a day (BID) | ORAL | Status: DC
  Administered 2017-05-13: 325 mg via ORAL
  Filled 2017-05-13: qty 1

## 2017-05-13 MED ORDER — LIDOCAINE HCL (CARDIAC) 20 MG/ML IV SOLN
INTRAVENOUS | Status: DC | PRN
  Administered 2017-05-13: 60 mg via INTRATRACHEAL

## 2017-05-13 MED ORDER — PANTOPRAZOLE SODIUM 40 MG PO TBEC: 40.0000 mg | DELAYED_RELEASE_TABLET | Freq: Every day | ORAL | Status: DC

## 2017-05-13 MED ORDER — ACETAMINOPHEN 325 MG PO TABS: 650.0000 mg | ORAL_TABLET | Freq: Four times a day (QID) | ORAL | Status: DC | PRN

## 2017-05-13 MED ORDER — AMITRIPTYLINE HCL 25 MG PO TABS: 25.0000 mg | ORAL_TABLET | Freq: Every day | ORAL | 0 refills | Status: DC

## 2017-05-13 MED ORDER — LACTATED RINGERS IV SOLN
INTRAVENOUS | Status: DC | PRN
  Administered 2017-05-13: 14:00:00 via INTRAVENOUS

## 2017-05-13 MED ORDER — AMITRIPTYLINE HCL 25 MG PO TABS: 25.0000 mg | ORAL_TABLET | Freq: Every day | ORAL | Status: DC

## 2017-05-13 MED ORDER — MEGESTROL ACETATE 40 MG PO TABS: 40.0000 mg | ORAL_TABLET | Freq: Two times a day (BID) | ORAL | 0 refills | Status: DC

## 2017-05-13 NOTE — Care Management Note (Signed)
Case Management Note  Patient Details  Name: Diana Mcdowell MRN: [id number] Date of Birth: Aug 27, 1976  Subjective/Objective:            CM following for progression and d/c planning.         Action/Plan: 05/13/2017 Met with pt who has no PCP, uses the ED for medical care and works however has no insurance. Followup appointment scheduled at the [facility] for Tuesday, May 21, 2017 @ 10:30am. Will provide pt with a [facility] letter for multiple medications.   Expected Discharge Date:    05/13/2017              Expected Discharge Plan:  Home/Self Care  In-House Referral:  NA  Discharge planning Services  NA  Post Acute Care Choice:  NA Choice offered to:  NA  DME Arranged:  N/A DME Agency:  NA  HH Arranged:  NA HH Agency:  NA  Status of Service:  Completed, signed off  If discussed at [place] of Stay Meetings, dates discussed:    Additional Comments:  Adron Bene, RN 05/13/2017, 3:24 PM

## 2017-05-13 NOTE — Anesthesia Procedure Notes (Signed)
Procedure Name: MAC Date/Time: 05/13/2017 1:50 PM Performed by: Teressa Lower Pre-anesthesia Checklist: Patient identified, Emergency Drugs available, Suction available, Patient being monitored and Timeout performed Patient Re-evaluated:Patient Re-evaluated prior to inductionOxygen Delivery Method: Nasal cannula

## 2017-05-13 NOTE — Discharge Instructions (Signed)
Follow with sickle cell clinic  CBC at Dr [facility] on 6/19 Get CBC, CMP,checked  by Primary MD next visit.    Activity: As tolerated with Full fall precautions use walker/cane & assistance as needed   Disposition Home    Diet: regular diet  On your next visit with your primary care physician please Get Medicines reviewed and adjusted.   Please request your Prim.MD to go over all Hospital Tests and Procedure/Radiological results at the follow up, please get all Hospital records sent to your Prim MD by signing hospital release before you go home.   If you experience worsening of your admission symptoms, develop shortness of breath, life threatening emergency, suicidal or homicidal thoughts you must seek medical attention immediately by calling 911 or calling your MD immediately  if symptoms less severe.  You Must read complete instructions/literature along with all the possible adverse reactions/side effects for all the Medicines you take and that have been prescribed to you. Take any new Medicines after you have completely understood and accpet all the possible adverse reactions/side effects.   Do not drive, operating heavy machinery, perform activities at heights, swimming or participation in water activities or provide baby sitting services if your were admitted for syncope or siezures until you have seen by Primary MD or a Neurologist and advised to do so again.  Do not drive when taking Pain medications.    Do not take more than prescribed Pain, Sleep and Anxiety Medications  Special Instructions: If you have smoked or chewed Tobacco  in the last 2 yrs please stop smoking, stop any regular Alcohol  and or any Recreational drug use.  Wear Seat belts while driving.   Please note  You were cared for by a hospitalist during your hospital stay. If you have any questions about your discharge medications or the care you received while you were in the hospital after you are  discharged, you can call the unit and asked to speak with the hospitalist on call if the hospitalist that took care of you is not available. Once you are discharged, your primary care physician will handle any further medical issues. Please note that NO REFILLS for any discharge medications will be authorized once you are discharged, as it is imperative that you return to your primary care physician (or establish a relationship with a primary care physician if you do not have one) for your aftercare needs so that they can reassess your need for medications and monitor your lab values.

## 2017-05-13 NOTE — Progress Notes (Signed)
   See EGD note  In recovery still very tearful about son's death.  [facility] and get some care for GYN issues and also needs grief counseling if possible and maybe additional psychotropic meds.  Was taking Goody PM powders qhs since son's death.  Iva Booparl E. Noble Cicalese, MD, [facility] [phone] (pager) [phone] after 5 PM, weekends and holidays  05/13/2017 2:18 PM

## 2017-05-13 NOTE — Transfer of Care (Signed)
Immediate Anesthesia Transfer of Care Note  Patient: Arbor Leer  Procedure(s) Performed: Procedure(s): ESOPHAGOGASTRODUODENOSCOPY (EGD) (N/A)  Patient Location: Endoscopy Unit  Anesthesia Type:MAC  Level of Consciousness: awake, alert  and oriented  Airway & Oxygen Therapy: Patient Spontanous Breathing  Post-op Assessment: Report given to RN and Post -op Vital signs reviewed and stable  Post vital signs: Reviewed and stable  Last Vitals:  Vitals:   05/13/17 0933 05/13/17 1248  BP: 122/85 (!) 143/98  Pulse: 77   Resp: 18 12  Temp: 37.2 C 37 C    Last Pain:  Vitals:   05/13/17 1248  TempSrc: Oral  PainSc:          Complications: No apparent anesthesia complications

## 2017-05-13 NOTE — Anesthesia Postprocedure Evaluation (Signed)
Anesthesia Post Note  Patient: Diana Mcdowell  Procedure(s) Performed: Procedure(s) (LRB): ESOPHAGOGASTRODUODENOSCOPY (EGD) (N/A)     Patient location during evaluation: PACU Anesthesia Type: MAC Level of consciousness: awake and alert Pain management: pain level controlled Vital Signs Assessment: post-procedure vital signs reviewed and stable Respiratory status: spontaneous breathing, nonlabored ventilation, respiratory function stable and patient connected to nasal cannula oxygen Cardiovascular status: stable and blood pressure returned to baseline Anesthetic complications: no    Last Vitals:  Vitals:   05/13/17 1401 05/13/17 1410  BP: (!) 140/91   Pulse: 73 (!) 59  Resp: 15 13  Temp: 37 C     Last Pain:  Vitals:   05/13/17 1401  TempSrc: Oral  PainSc:                  Damonta Cossey S

## 2017-05-13 NOTE — Op Note (Signed)
[facility] Patient Name: Diana Mcdowell Procedure Date : 05/13/2017 MRN: [id number] Attending MD: Iva Boop , MD Date of Birth: [birth year] CSN: [id number] Age: 41 Admit Type: Inpatient Procedure:                Upper GI endoscopy Indications:              Iron deficiency anemia secondary to chronic blood                            loss, Melena Providers:                Iva Boop, MD, Roselie Awkward, RN, Oletha Blend, Technician Referring MD:              Medicines:                Propofol per Anesthesia, Monitored Anesthesia Care Complications:            No immediate complications. Estimated Blood Loss:     Estimated blood loss was minimal. Procedure:                Pre-Anesthesia Assessment:                           - Prior to the procedure, a History and Physical                            was performed, and patient medications and                            allergies were reviewed. The patient's tolerance of                            previous anesthesia was also reviewed. The risks                            and benefits of the procedure and the sedation                            options and risks were discussed with the patient.                            All questions were answered, and informed consent                            was obtained. Prior Anticoagulants: The patient                            last took aspirin 1 day and naproxen 1 day prior to                            the procedure. ASA Grade Assessment: II - A patient  with mild systemic disease. After reviewing the                            risks and benefits, the patient was deemed in                            satisfactory condition to undergo the procedure.                           After obtaining informed consent, the endoscope was                            passed under direct vision. Throughout the    procedure, the patient's blood pressure, pulse, and                            oxygen saturations were monitored continuously. The                            EG-[id number] ([id number]) scope was introduced through the                            mouth, and advanced to the second part of duodenum.                            The upper GI endoscopy was accomplished without                            difficulty. The patient tolerated the procedure                            well. Scope In: Scope Out: Findings:      One non-bleeding superficial gastric ulcer with no stigmata of bleeding       was found in the gastric antrum. The lesion was 10 mm in largest       dimension.      The exam was otherwise without abnormality.      The cardia and gastric fundus were normal on retroflexion.      Biopsies were taken with a cold forceps in the gastric antrum for       Helicobacter pylori testing using CLOtest. Verification of patient       identification for the specimen was done. Estimated blood loss was       minimal. Impression:               - Non-bleeding gastric ulcer with no stigmata of                            bleeding.                           - The examination was otherwise normal.                           - Biopsies were taken with a cold forceps for  Helicobacter pylori testing using CLOtest. Moderate Sedation:      Please see anesthesia notes, moderate sedation not given Recommendation:           - Return patient to hospital ward for ongoing care.                           - Resume regular diet.                           - 1) Daily OTC PPI at DC x 2 months - would have                            her get omeprazole 20 mg or lansoprazole 15 mg                            generic and take before breakfast                           2) STOP NSAID's                           3) I started amitriptyline 25 mg hs for headaches                            and insomnia - its  a start - multiple situational                            issues including grief over son's murder last year                           4) oral ferrous sulfate 325 mg bid                           5) Outpatient GYN evaluatuion I bet main anemia                            cause is metromenorrhagia                           6) CBC my office next week - I have ordered and she                            can come 6/19.                           7) I will f/u CLO test                           8) Home today vs tomorrow                           - Continue present medications. Procedure Code(s):        --- Professional ---  [id number], Esophagogastroduodenoscopy, flexible,                            transoral; with biopsy, single or multiple Diagnosis Code(s):        --- Professional ---                           K25.9, Gastric ulcer, unspecified as acute or                            chronic, without hemorrhage or perforation                           D50.0, Iron deficiency anemia secondary to blood                            loss (chronic)                           K92.1, Melena (includes Hematochezia) CPT copyright 2016 American Medical Association. All rights reserved. The codes documented in this report are preliminary and upon coder review may  be revised to meet current compliance requirements. Iva Booparl E Gessner, MD 05/13/2017 2:09:13 PM This report has been signed electronically. Number of Addenda: 0

## 2017-05-13 NOTE — Anesthesia Preprocedure Evaluation (Signed)
Anesthesia Evaluation  Patient identified by MRN, date of birth, ID band Patient awake    Reviewed: Allergy & Precautions, NPO status , Patient's Chart, lab work & pertinent test results  Airway Mallampati: II  TM Distance: >3 FB Neck ROM: Full    Dental no notable dental hx.    Pulmonary Current Smoker,    Pulmonary exam normal breath sounds clear to auscultation       Cardiovascular negative cardio ROS Normal cardiovascular exam Rhythm:Regular Rate:Normal     Neuro/Psych negative neurological ROS  negative psych ROS   GI/Hepatic negative GI ROS, Neg liver ROS,   Endo/Other  negative endocrine ROS  Renal/GU negative Renal ROS  negative genitourinary   Musculoskeletal negative musculoskeletal ROS (+)   Abdominal   Peds negative pediatric ROS (+)  Hematology negative hematology ROS (+)   Anesthesia Other Findings   Reproductive/Obstetrics negative OB ROS                             Anesthesia Physical Anesthesia Plan  ASA: II  Anesthesia Plan: MAC   Post-op Pain Management:    Induction: Intravenous  PONV Risk Score and Plan: 0  Airway Management Planned: Simple Face Mask  Additional Equipment:   Intra-op Plan:   Post-operative Plan: Extubation in OR  Informed Consent: I have reviewed the patients History and Physical, chart, labs and discussed the procedure including the risks, benefits and alternatives for the proposed anesthesia with the patient or authorized representative who has indicated his/her understanding and acceptance.   Dental advisory given  Plan Discussed with: CRNA and Surgeon  Anesthesia Plan Comments:         Anesthesia Quick Evaluation

## 2017-05-13 NOTE — Progress Notes (Signed)
Diana Mcdowell to be D/C'd Home per MD order.  Discussed prescriptions and follow up appointments with the patient. Prescriptions given to patient, medication list explained in detail. Pt verbalized understanding.  Allergies as of 05/13/2017   No Known Allergies     Medication List    STOP taking these medications   aspirin EC 325 MG tablet   doxylamine (Sleep) 25 MG tablet Commonly known as:  UNISOM   naproxen sodium 220 MG tablet Commonly known as:  ANAPROX     TAKE these medications   acetaminophen 325 MG tablet Commonly known as:  TYLENOL Take 2 tablets (650 mg total) by mouth every 6 (six) hours as needed for mild pain (or Fever >/= 101).   amitriptyline 25 MG tablet Commonly known as:  ELAVIL Take 1 tablet (25 mg total) by mouth at bedtime.   ferrous sulfate 325 (65 FE) MG tablet Take 1 tablet (325 mg total) by mouth 2 (two) times daily with a meal.   megestrol 40 MG tablet Commonly known as:  MEGACE Take 1 tablet (40 mg total) by mouth 2 (two) times daily.   pantoprazole 40 MG tablet Commonly known as:  PROTONIX Take 1 tablet (40 mg total) by mouth daily before breakfast. Start taking on:  05/14/2017       Vitals:   05/13/17 1410 05/13/17 1503  BP:  (!) 144/84  Pulse: (!) 59 62  Resp: 13 16  Temp:  99.1 F (37.3 C)    Skin clean, dry and intact without evidence of skin break down, no evidence of skin tears noted. IV catheter discontinued intact. Site without signs and symptoms of complications. Dressing and pressure applied. Pt denies pain at this time. No complaints noted.  An After Visit Summary was printed and given to the patient. Patient escorted via WC, and D/C home via private auto.  Britt BologneseAnisha Mabe RN, BSN

## 2017-05-13 NOTE — Discharge Summary (Signed)
Diana Mcdowell, is a 41 y.o. female  DOB [birth year]  MRN [id number].  Admission date:  05/11/2017  Admitting Physician  Clydie Braun, MD  Discharge Date:  05/13/2017   Primary MD  Patient, No Pcp Per  Recommendations for primary care physician for things to follow:  - Check CBC, BMP during next visit, it can be done at [facility], otherwise Dr. Leone Payor scheduled for CBC follow-up that same day. 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 Anemia   . Chronic headaches 05/13/2017  . 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 Iron deficiency anemia due to chronic blood loss - metromenorrhagia and GI bleeding from NSAID's   . Iron deficiency anemia due to chronic blood loss - metromenorrhagia and GI bleeding from NSAID's   . Menorrhagia with irregular cycle 05/13/2017  . Migraines     Past Surgical History:  Procedure Laterality Date  . TUBAL LIGATION  2002       History of present illness and  Hospital Course:     Kindly see H&P for history of present illness and admission details, please review complete Labs, Consult reports and Test reports for all details in brief  HPI  from the history and physical done on the day of admission06/08/2017 HPI: Diana Mcdowell is a 41 y.o.  female with medical history significant of anemia and headaches, who presents for epigastric abdominal pain over the last week. Patient describes pain as a constant dull. Patient admits to frequent use of BC and naproxen to treat headache symptoms for months to years. She has had associated symptoms of generalized fatigue, nausea, shortness of breath, and dark stools. Patient denies any significant cough, loss of consciousness, chest pain, palpitations, dysuria, fever, or chills. Patient notes being very anxious about being away from her children since the death of one of her children back in 2016 and initially wanted to not be admitted into the hospital. She reports she doesn't know how she does not sleep given the fact that she normally takes Aleve PM to sleep at night.  ED Course: Upon admission to the emergency department patient was seen to be afebrile with vital signs relatively within normal limits. Labs revealed a hemoglobin 6.5 with low MCV/ MCH, platelet count 579. Patient was given 40 mg Protonix and 1 unit of packed red blood cells while in the ED.   Hospital Course  40 y.o.femalewith medical history significant of anemia and headaches, who presents for epigastric abdominal pain over the last week, workup significant anemia with hemoglobin of 6.5  Symptomatic anemia - This is iron deficiency anemia secondary to chronic blood loss from metromenorrhagia and gastric ulcer. - A shunt was transfused 2 units PRBC during hospital stay, hemoglobin is 9.1 on discharge, she will be discharged on oral iron supplements.  Upper GI bleed secondary to Gastric ulcer - Secondary to NSAIDs use, status post endoscopy 6/11 by Dr. Leone Payor, with finding of nonbleeding gastric ulcer with no stigmata of bleeding, biopsies were sent. - Noted to avoid NSAID, she will be discharged on PPI  Metromenorrhagia - Patient reports heavy irregular menstrual period, will check ultrasound with no acute abnormalities,  Discussed with GYN Dr. Penne Lash on 6/10, as will discuss with Dr. Vergie Living on 6/11, will start on Megace 40 mg twice a day until she sees my [facility] clinic.  Tobacco abuse - She was counseled  Epigastric abdominal pain - Likely related to gastritis versus gastric ulcers from NSAIDs use, resolved  Grief disorder - Over her son murdered last year, she was prescribed amitriptyline for headache and insomnia, may benefit from grief counseling and SSRI in outpatient setting   Discharge Condition:  Stable   Follow UP  Follow-up Information    Iva Boop, MD Follow up on 05/21/2017.   Specialty:  Gastroenterology Why:  Go to basement for lab test and Dr. Leone Payor will call results and plans Contact information: [address] [place] [address] [phone]        [facility] Follow up.   Why:  Followup appointment: Tuesday May 21, 2017 at 10:30am. Please call if you are unable to keep this appointment.  Contact information: [address] [id number] mc [place] [address] [phone]       Lesly Dukes, MD. Schedule an appointment as soon as possible for a visit in 1 week(s).   Specialty:  Obstetrics and Gynecology Contact information: [address] [place] [address] [phone]             Discharge Instructions  and  Discharge Medications     Discharge Instructions    Discharge instructions    Complete by:  As directed    Follow with [facility] clinic  CBC at Dr Aaron Mose on 6/19 Get CBC, CMP,checked  by Primary MD next visit.    Activity: As tolerated with Full fall precautions use walker/cane & assistance as needed   Disposition Home    Diet: regular diet  On your next visit with your primary care physician please Get Medicines reviewed and adjusted.   Please request your Prim.MD to go over all Hospital Tests and Procedure/Radiological results at the follow up, please get  all Hospital records sent to your Prim MD by signing hospital release before you go home.   If you experience worsening of your admission symptoms, develop shortness of breath, life threatening emergency, suicidal or homicidal thoughts you must seek medical attention immediately by calling 911 or calling your MD immediately  if symptoms less severe.  You Must read complete instructions/literature along with all the possible adverse reactions/side effects for all the Medicines you take and that have been prescribed to you. Take any new Medicines after you have completely understood and accpet all the possible adverse reactions/side effects.   Do not drive, operating heavy machinery, perform activities at heights, swimming or participation in  water activities or provide baby sitting services if your were admitted for syncope or siezures until you have seen by Primary MD or a Neurologist and advised to do so again.  Do not drive when taking Pain medications.    Do not take more than prescribed Pain, Sleep and Anxiety Medications  Special Instructions: If you have smoked or chewed Tobacco  in the last 2 yrs please stop smoking, stop any regular Alcohol  and or any Recreational drug use.  Wear Seat belts while driving.   Please note  You were cared for by a hospitalist during your hospital stay. If you have any questions about your discharge medications or the care you received while you were in the hospital after you are discharged, you can call the unit and asked to speak with the hospitalist on call if the hospitalist that took care of you is not available. Once you are discharged, your primary care physician will handle any further medical issues. Please note that NO REFILLS for any discharge medications will be authorized once you are discharged, as it is imperative that you return to your primary care physician (or establish a relationship with a primary care physician if you do not have one)  for your aftercare needs so that they can reassess your need for medications and monitor your lab values.   Increase activity slowly    Complete by:  As directed      Allergies as of 05/13/2017   No Known Allergies     Medication List    STOP taking these medications   aspirin EC 325 MG tablet   doxylamine (Sleep) 25 MG tablet Commonly known as:  UNISOM   naproxen sodium 220 MG tablet Commonly known as:  ANAPROX     TAKE these medications   acetaminophen 325 MG tablet Commonly known as:  TYLENOL Take 2 tablets (650 mg total) by mouth every 6 (six) hours as needed for mild pain (or Fever >/= 101).   amitriptyline 25 MG tablet Commonly known as:  ELAVIL Take 1 tablet (25 mg total) by mouth at bedtime.   ferrous sulfate 325 (65 FE) MG tablet Take 1 tablet (325 mg total) by mouth 2 (two) times daily with a meal.   megestrol 40 MG tablet Commonly known as:  MEGACE Take 1 tablet (40 mg total) by mouth 2 (two) times daily.   pantoprazole 40 MG tablet Commonly known as:  PROTONIX Take 1 tablet (40 mg total) by mouth daily before breakfast. Start taking on:  05/14/2017         Diet and Activity recommendation: See Discharge Instructions above   Consults obtained -  GI   Major procedures and Radiology Reports - PLEASE review detailed and final reports for all details, in brief -   EGD 6/11 by Dr. Leone Payor Impression:               - Non-bleeding gastric ulcer with no stigmata of                            bleeding.                           - The examination was otherwise normal.                           - Biopsies were taken with a  cold forceps for                            Helicobacter pylori testing using CLOtest  Dg Chest 2 View  Result Date: 05/11/2017 CLINICAL DATA:  Chest pain EXAM: CHEST  2 VIEW COMPARISON:  10/05/2015 FINDINGS: Midline trachea. Borderline cardiomegaly. Mediastinal contours otherwise within normal limits. No pleural effusion or  pneumothorax. Clear lungs. IMPRESSION: Borderline cardiomegaly, without acute disease. Electronically Signed   By: Jeronimo GreavesKyle  Talbot M.D.   On: 05/11/2017 16:41   [place] Transvaginal Non-ob  Result Date: 05/12/2017 CLINICAL DATA:  Metrorrhagia EXAM: TRANSABDOMINAL AND TRANSVAGINAL ULTRASOUND OF PELVIS TECHNIQUE: Both transabdominal and transvaginal ultrasound examinations of the pelvis were performed. Transabdominal technique was performed for global imaging of the pelvis including uterus, ovaries, adnexal regions, and pelvic cul-de-sac. It was necessary to proceed with endovaginal exam following the transabdominal exam to visualize the endometrium. COMPARISON:  None FINDINGS: Uterus Measurements: 7.7 x 4.1 x 5.4 cm, within normal limits. No fibroids or other mass visualized. The uterus is anteflexed. Endometrium Thickness: 1 mm.  No focal abnormality visualized. Right ovary Measurements: 4.0 x 2.8 x 2.7 cm, within normal limits. Normal appearance/no adnexal mass. A dominant follicle 1.7 cm. Left ovary Measurements: 2.7 x 1.9 x 1.4 cm, within normal limits. Normal appearance/no adnexal mass. Other findings No abnormal free fluid. IMPRESSION: 1. Normal sonographic appearance of the uterus and adnexa without acute or focal lesion to explain the patient's metrorrhagia. Electronically Signed   By: Marin Robertshristopher  Mattern M.D.   On: 05/12/2017 19:31   [place] Pelvis Complete  Result Date: 05/12/2017 CLINICAL DATA:  Metrorrhagia EXAM: TRANSABDOMINAL AND TRANSVAGINAL ULTRASOUND OF PELVIS TECHNIQUE: Both transabdominal and transvaginal ultrasound examinations of the pelvis were performed. Transabdominal technique was performed for global imaging of the pelvis including uterus, ovaries, adnexal regions, and pelvic cul-de-sac. It was necessary to proceed with endovaginal exam following the transabdominal exam to visualize the endometrium. COMPARISON:  None FINDINGS: Uterus Measurements: 7.7 x 4.1 x 5.4 cm, within normal limits. No  fibroids or other mass visualized. The uterus is anteflexed. Endometrium Thickness: 1 mm.  No focal abnormality visualized. Right ovary Measurements: 4.0 x 2.8 x 2.7 cm, within normal limits. Normal appearance/no adnexal mass. A dominant follicle 1.7 cm. Left ovary Measurements: 2.7 x 1.9 x 1.4 cm, within normal limits. Normal appearance/no adnexal mass. Other findings No abnormal free fluid. IMPRESSION: 1. Normal sonographic appearance of the uterus and adnexa without acute or focal lesion to explain the patient's metrorrhagia. Electronically Signed   By: Marin Robertshristopher  Mattern M.D.   On: 05/12/2017 19:31    Micro Results     Recent Results (from the past 240 hour(s))  Wet prep, genital     Status: Abnormal   Collection Time: 05/06/17 10:00 AM  Result Value Ref Range Status   Yeast Wet Prep HPF POC NONE SEEN NONE SEEN Final   Trich, Wet Prep NONE SEEN NONE SEEN Final   Clue Cells Wet Prep HPF POC NONE SEEN NONE SEEN Final   WBC, Wet Prep HPF POC FEW (A) NONE SEEN Final    Comment: FEW BACTERIA SEEN   Sperm NONE SEEN  Final       Today   Subjective:   Diana MulletDebra Zeitlin today has no headache,no chest or abdominal pain,no new weakness tingling or numbness, feels much better wants to go home today.   Objective:   Blood pressure (!) 140/91, pulse (!) 59, temperature 98.6  F (37 C), temperature source Oral, resp. rate 13, height 5\' 4"  (1.626 m), weight 59.9 kg (132 lb), last menstrual period 05/06/2017, SpO2 100 %.   Intake/Output Summary (Last 24 hours) at 05/13/17 1530 Last data filed at 05/13/17 1356  Gross per 24 hour  Intake          3201.25 ml  Output                0 ml  Net          3201.25 ml    Exam Awake Alert, Oriented x 3, No new F.N deficits, Normal affect Symmetrical Chest wall movement, Good air movement bilaterally, CTAB RRR,No Gallops,Rubs or new Murmurs, No Parasternal Heave +ve B.Sounds, Abd Soft, Non tender, No rebound -guarding or rigidity. No Cyanosis,  Clubbing or edema.  Data Review   CBC w Diff: Lab Results  Component Value Date   WBC 7.1 05/13/2017   HGB 9.1 (L) 05/13/2017   HCT 30.9 (L) 05/13/2017   PLT 553 (H) 05/13/2017   LYMPHOPCT 35 05/06/2008   MONOPCT 8 05/06/2008   EOSPCT 1 05/06/2008   BASOPCT 1 05/06/2008    CMP: Lab Results  Component Value Date   NA 136 05/12/2017   K 3.4 (L) 05/12/2017   CL 106 05/12/2017   CO2 22 05/12/2017   BUN 8 05/12/2017   CREATININE 0.61 05/12/2017   PROT 7.2 05/11/2017   ALBUMIN 4.1 05/11/2017   BILITOT 0.2 (L) 05/11/2017   ALKPHOS 32 (L) 05/11/2017   AST 26 05/11/2017   ALT 11 (L) 05/11/2017  .   Total Time in preparing paper work, data evaluation and todays exam - 35 minutes  Jissel Slavens M.D on 05/13/2017 at 3:30 PM  [facility]   Office  [phone]

## 2017-05-13 NOTE — Interval H&P Note (Signed)
History and Physical Interval Note:  05/13/2017 1:41 PM  Diana MulletDebra Mcdowell  has presented today for surgery, with the diagnosis of anemia.  black stool.  epigastric pain.  The various methods of treatment have been discussed with the patient and family. After consideration of risks, benefits and other options for treatment, the patient has consented to  Procedure(s): ESOPHAGOGASTRODUODENOSCOPY (EGD) (N/A) as a surgical intervention .  The patient's history has been reviewed, patient examined, no change in status, stable for surgery.  I have reviewed the patient's chart and labs.  Questions were answered to the patient's satisfaction.     Stan Headarl Jacobus Colvin

## 2017-05-13 NOTE — H&P (View-Only) (Signed)
[facility] Consult: 10:13 AM 05/12/2017  LOS: 1 day    Referring Provider: Dr Randol KernElgergawy  Primary Care Physician:  Patient, No Pcp Per Primary Gastroenterologist:  unassigned     Reason for Consultation:  Anemia.  Epigastric pain.      HPI: Diana MulletDebra Mcdowell is a 41 y.o. female.  PMH migraine type headaches and anemia (Hgb of 8.5 and MCV of 66 in 10/2015, at that time was prescribed po Iron).  [id number].  S/p BTL 2002.    Uses lots of BCs and Naproxen for HA.  Long standing menorrhagia, 2 periods per month.  First last 7 to 9 days, passes large clots of blood,can go through 24 pads in 24 hours.  Second period mid to later in month also with heavy bleeding, lasts 3 to 5 days.  Depressed and worried since murder of 42 y/o son in 09/2016.    Uses 6 to 8 Aleve daily and 3 to 4 BC powders per week for headaches.   For 2 weeks having epigastric/xyphoid pain, 6/10 severity: sharp, stabbing.  Not taking anything for it other than the usual meds for headache.  For several months having intermittent dark sometimes red blood with stools.  Last Friday, 8 days ago, stool black and tarry, but then resumed dark brown.  The following Tuesday started fatigue, DOE, tachycardia with minor activity, some dizziness.  No fever or chills.  Appetite reduced, no n/v.  PO does not make sxs worse.      Hgb 6.5 >> 1 unit PRBC >> 7.7.  MCV 62.  FOBT negative.  Potassium of 3.4 but normal renal function.  CXR with borderline CM.     Seen in clinic on 6/4 for epigastric pain and urinary sxs.  U/A with TNTC RBCs but no grossly bloody. Treated with Bactrim for UTI.  Also reported 9 day menses starting 5/23, normal for her.  Woke up with vaginal bleeding on 6/4, apparently not unusual.  Long time since last gyn exam.   No ETOH and no hx snorting or  injecting drugs.  Smokes 7 cigs/day.   FM hx neg for ulcers, colorectal cancer or disease, liver disease.     Past Medical History:  Diagnosis Date  . Anemia   . Migraines     Past Surgical History:  Procedure Laterality Date  . TUBAL LIGATION  2002    Prior to Admission medications   Medication Sig Start Date End Date Taking? Authorizing Provider  aspirin EC 325 MG tablet Take 325 mg by mouth daily as needed (for headaches).   Yes [provider]  doxylamine, Sleep, (UNISOM) 25 MG tablet Take 25 mg by mouth at bedtime as needed for sleep.   Yes [provider]  naproxen sodium (ANAPROX) 220 MG tablet Take 220-440 mg by mouth 2 (two) times daily as needed (pain or cramping).    Yes [provider]    Scheduled Meds: . pantoprazole  40 mg Intravenous Q12H   Infusions: . sodium chloride    . sodium chloride 75 mL/hr at 05/12/17  0035   PRN Meds: acetaminophen **OR** acetaminophen, albuterol, LORazepam, ondansetron **OR** ondansetron (ZOFRAN) IV   Allergies as of 05/11/2017  . (No Known Allergies)    Family History  Problem Relation Age of Onset  . Hypertension Mother   . Diabetes Mother   . Cancer Maternal Grandfather   . Diabetes Paternal Grandmother     Social History   Social History  . Marital status: Single    Spouse name: N/A  . Number of children: N/A  . 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 Smoking status: Current Every Day Smoker    Packs/day: 0.50    Types: Cigarettes  . Smokeless tobacco: Never Used  . Alcohol use No  . Drug use: No  . Sexual activity: Not on file   Other Topics Concern  . Not on file   Social History Narrative  . No narrative on file    REVIEW OF SYSTEMS: Constitutional:  Per HPI ENT:  No nose bleeds.  Dental pain and tooth decay are a problem Pulm:  Per HPI.  Usually no SOB CV:  No palpitations, no LE edema.  GU:  No hematuria,  no frequency GI:  Per HPI Heme:  No unusual bleeding other than GYN and    Transfusions:  None before now Neuro:  No headaches, no peripheral tingling or numbness Derm:  Knot on base of neck, has gotten larger over the years.  No itching, no rash or sores.   Endocrine:  No sweats or chills.  No polyuria or dysuria Immunization:  Not queried Travel:  None beyond local counties in last few months.    PHYSICAL EXAM: Vital signs in last 24 hours: Vitals:   05/12/17 0925 05/12/17 1000  BP: 119/81 126/80  Pulse: 68 69  Resp: 18 18  Temp: 98.6 F (37 C) 99.6 F (37.6 C)   Wt Readings from Last 3 Encounters:  05/12/17 60.1 kg (132 lb 7.9 oz)  05/06/17 59.9 kg (132 lb)    General: pleasant, comfortable.  Looks well.   Head:  No asymmetry or swelling  Eyes:  No icterus or pallor Ears:  Not HOH  Nose:  No congestion or discharge Mouth:  Moist and clear oral MM.  Tongue midline.  Tooth decay and some missing.  Neck:  Benign feeling mass at base of neck, overlying spine, mobile and not tender (? sebbaceous cyst vs lipoma).  No JVD, no TMG Lungs:  Clear bil.  No labored breathing or cough Heart: RRR.  No MRG.  S1, S2 present.   Abdomen:  Soft, ND.  No hernias, masses, HSM, Bruits.  Tender without guarding/rebound in upper abdomen.  Rectal: no mass.  No stool to sample   Musc/Skeltl: no joint swelling Extremities:  No CCE  Neurologic:  Alert.  Oriented x 3.  Strength full.  Able to walk unaided to toilet without weakness.  Skin:  No rash, no sores.   Soft, NT mass at cervical spine level as per neck exam Nodes:  No cervical adenopathy.   Psych:  Tearful at times.  Calm.  Appropriate.    Intake/Output from previous day: 06/09 0701 - 06/10 0700 In: 1007.9 [I.V.:337.9; Blood:670] Out: 0  Intake/Output this shift: Total I/O In: 240 [P.O.:240] Out: 0   LAB RESULTS:  Recent Labs  05/11/17 1555 05/12/17 0419 05/12/17 0747  WBC 7.0  --   --   HGB 6.5* 7.6*  7.5* 7.7*  HCT  23.3* 26.8*  26.5* 26.8*  PLT 579*  --   --    BMET Lab Results  Component Value Date   NA 136 05/12/2017   NA 134 (L) 05/11/2017   NA 136 10/05/2015   K 3.4 (L) 05/12/2017   K 4.0 05/11/2017   K 3.6 10/05/2015   CL 106 05/12/2017   CL 106 05/11/2017   CL 105 10/05/2015   CO2 22 05/12/2017   CO2 22 05/11/2017   CO2 22 10/05/2015   GLUCOSE 80 05/12/2017   GLUCOSE 92 05/11/2017   GLUCOSE 87 10/05/2015   BUN 8 05/12/2017   BUN 10 05/11/2017   BUN 9 10/05/2015   CREATININE 0.61 05/12/2017   CREATININE 0.73 05/11/2017   CREATININE 0.76 10/05/2015   CALCIUM 8.7 (L) 05/12/2017   CALCIUM 9.4 05/11/2017   CALCIUM 9.4 10/05/2015   LFT  Recent Labs  05/11/17 1555  PROT 7.2  ALBUMIN 4.1  AST 26  ALT 11*  ALKPHOS 32*  BILITOT 0.2*  BILIDIR <0.1*  IBILI NOT CALCULATED   PT/INR No results found for: INR, PROTIME Hepatitis Panel No results for input(s): HEPBSAG, HCVAB, HEPAIGM, HEPBIGM in the last 72 hours. C-Diff No components found for: CDIFF Lipase     Component Value Date/Time   LIPASE 20 05/11/2017 1555    Drugs of Abuse  No results found for: LABOPIA, COCAINSCRNUR, LABBENZ, AMPHETMU, THCU, LABBARB   RADIOLOGY STUDIES: Dg Chest 2 View  Result Date: 05/11/2017 CLINICAL DATA:  Chest pain EXAM: CHEST  2 VIEW COMPARISON:  10/05/2015 FINDINGS: Midline trachea. Borderline cardiomegaly. Mediastinal contours otherwise within normal limits. No pleural effusion or pneumothorax. Clear lungs. IMPRESSION: Borderline cardiomegaly, without acute disease. Electronically Signed   By: Jeronimo Greaves M.D.   On: 05/11/2017 16:41     IMPRESSION:   *  Microcytic anemia.  From menorrhagia and suspect superimposed GI blood loss with intermittent black, bloody stools.    *  Migraine type headaches.  Needs better strategy than NSAIDs, ASA for controlling pain   *  Menorrhagia, long standing.  Needs evaluation.  Last pelvic exam ~ 2014, not in [place].     PLAN:     *  CBC in AM.   EGD in AM.    *  Switch to BID Protonix.    *  Would discharge on po Iron.  Consider iron infusion while inpt.  Needs Gyn referral, ? Pelvic ultrasound to assess for fibroids.     Jennye Moccasin  05/12/2017, 10:13 AM Pager: [phone]   Attending physician's note   I have taken a history, examined the patient and reviewed the chart. I agree with the Advanced Practitioner's note, impression and recommendations. 41 year old female with chronic anemia, frequent excessive use of nsaids admitted with symptomatic anemia. She complains of epigastric abdominal pain. Hemoglobin responded appropriately to blood transfusion. No bowel movement since yesterday. Anemia likely multifactorial, we'll need to exclude GI blood loss even though fecal Hemoccult negative and also evaluate for possible gastroduodenal ulcer disease in the setting of chronic NSAID use. Schedule for EGD tomorrow The risks and benefits as well as alternatives of endoscopic procedure(s) have been discussed and reviewed. All questions answered. The patient agrees to proceed. Nothing by mouth after midnight PPI twice daily  K Scherry Ran, MD [phone] Mon-Fri 8a-5p [phone] after 5p, weekends, holidays

## 2017-05-14 ENCOUNTER — Encounter (HOSPITAL_COMMUNITY): Payer: Self-pay | Admitting: Internal Medicine

## 2017-05-14 LAB — CLOTEST (H. PYLORI), BIOPSY: Helicobacter screen: NEGATIVE

## 2017-05-15 ENCOUNTER — Encounter: Payer: Self-pay | Admitting: Internal Medicine

## 2017-05-15 NOTE — Progress Notes (Signed)
No H pylori Letter sent

## 2017-05-21 ENCOUNTER — Ambulatory Visit (INDEPENDENT_AMBULATORY_CARE_PROVIDER_SITE_OTHER): Payer: No Typology Code available for payment source | Admitting: Family Medicine

## 2017-05-21 ENCOUNTER — Encounter: Payer: Self-pay | Admitting: Family Medicine

## 2017-05-21 VITALS — BP 136/86 | HR 80 | Temp 98.2°F | Resp 14 | Ht 64.0 in | Wt 139.0 lb

## 2017-05-21 DIAGNOSIS — Z1231 Encounter for screening mammogram for malignant neoplasm of breast: Secondary | ICD-10-CM

## 2017-05-21 DIAGNOSIS — D649 Anemia, unspecified: Secondary | ICD-10-CM

## 2017-05-21 DIAGNOSIS — Z1239 Encounter for other screening for malignant neoplasm of breast: Secondary | ICD-10-CM

## 2017-05-21 LAB — COMPLETE METABOLIC PANEL WITH GFR
ALT: 12 U/L (ref 6–29)
AST: 18 U/L (ref 10–30)
Albumin: 4.2 g/dL (ref 3.6–5.1)
Alkaline Phosphatase: 34 U/L (ref 33–115)
BILIRUBIN TOTAL: 0.2 mg/dL (ref 0.2–1.2)
BUN: 10 mg/dL (ref 7–25)
CHLORIDE: 106 mmol/L (ref 98–110)
CO2: 23 mmol/L (ref 20–31)
Calcium: 9.4 mg/dL (ref 8.6–10.2)
Creat: 0.65 mg/dL (ref 0.50–1.10)
GLUCOSE: 77 mg/dL (ref 65–99)
POTASSIUM: 4.9 mmol/L (ref 3.5–5.3)
SODIUM: 136 mmol/L (ref 135–146)
TOTAL PROTEIN: 7.1 g/dL (ref 6.1–8.1)

## 2017-05-21 MED ORDER — HYDROXYZINE HCL 50 MG PO TABS: 100.0000 mg | ORAL_TABLET | Freq: Every evening | ORAL | 1 refills | Status: AC | PRN

## 2017-05-21 NOTE — Progress Notes (Signed)
Patient ID: Gaby Harney, female    DOB: 01-15-1976, 41 y.o.   MRN: [id number]  PCP: Bing Neighbors, FNP  Chief Complaint  Patient presents with  . Establish Care  . Hospitalization Follow-up    Subjective:  HPI Edie Vallandingham is a 41 y.o. female presents today to establish care. She is uninsured and reports that it has been sometime since she has had any medical care She was previously seen and treated for symptomatic anemia at [facility] on 05/11/2017. Riely reports a history of heavy periods along with irregular bleeding for which she never had any gynecological evaluation. She reports that sometimes she has menstrual periods 2-3 times within a 30 day period. Prior to being admitted to the hospital for anemia, she experience 4 days of very heavy periods in which she was persistently passing blood clots. She became symptomatic experiencing dizziness and worsening fatigue and decided to go to the emergency room. During her hospitalization, her hemoglobin dropped to 6.5, she was transfused 2 units of packed red blood cells. She also had an endoscopic exam which revealed a abdominal bleeding ulcer. She has been under extreme stress due to her 74 year old son was murdered a few months ago. She also admits that she's been taking Aleve PM to try and induce sleep which may have contributed to his development of abdominal ulcer. Gavin Pound has a scheduled follow-up with [facility] to evaluate her gynecological status. She is scheduled to see Dr. Nicholaus Bloom on 05/30/2017. Today patient reports that she is currently asymptomatic. She denies any associated fatigue, dizziness, episodic bleeding. She is currently taken Megace and Protonix which she reports is causing dry mouth. During her recent hospitalization she was placed on amitriptyline to help with stress-induced insomnia. She reports that amitriptyline is not assisting her in achieving sleep.   Social History   Social History  .  Marital status: Single    Spouse name: N/A  . Number of children: N/A  . 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 Smoking status: Current Every Day Smoker    Packs/day: 0.50    Types: Cigarettes  . Smokeless tobacco: Never Used  . Alcohol use No  . Drug use: No  . Sexual activity: Not on file   Other Topics Concern  . Not on file   Social History Narrative  . No narrative on file    Family History  Problem Relation Age of Onset  . Hypertension Mother   . Diabetes Mother   . Cancer Maternal Grandfather   . Diabetes Paternal Grandmother    Review of Systems See history of present illness  Patient Active Problem List   Diagnosis Date Noted  . Menorrhagia with irregular cycle 05/13/2017  . 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 Chronic headaches 05/13/2017  . Melena   . Iron deficiency anemia due to chronic blood loss - metromenorrhagia and GI bleeding from NSAID's   . Acute blood loss anemia 05/12/2017  . Insomnia 05/12/2017  . GI bleed 05/11/2017    No Known Allergies  Prior to Admission medications   Medication Sig Start Date End Date Taking? Authorizing Provider  acetaminophen (TYLENOL) 325 MG tablet Take 2 tablets (650 mg total) by mouth every 6 (six) hours as needed for mild pain (or Fever >/= 101). 05/13/17  Yes Elgergawy, Leana Roe, MD  amitriptyline (ELAVIL) 25 MG tablet Take 1 tablet (25 mg total) by  mouth at bedtime. 05/13/17  Yes Elgergawy, Leana Roeawood S, MD  ferrous sulfate 325 (65 FE) MG tablet Take 1 tablet (325 mg total) by mouth 2 (two) times daily with a meal. 05/13/17  Yes Elgergawy, Leana Roeawood S, MD  megestrol (MEGACE) 40 MG tablet Take 1 tablet (40 mg total) by mouth 2 (two) times daily. 05/13/17  Yes Elgergawy, Leana Roeawood S, MD  pantoprazole (PROTONIX) 40 MG tablet Take 1 tablet (40 mg total) by mouth daily before breakfast. 05/14/17  Yes Elgergawy, Leana Roeawood S, MD    Past Medical, Surgical  Family and Social History reviewed and updated.    Objective:   Today's Vitals   05/21/17 1035  BP: 136/86  Pulse: 80  Resp: 14  Temp: 98.2 F (36.8 C)  TempSrc: Oral  SpO2: 100%  Weight: 139 lb (63 kg)  Height: 5\' 4"  (1.626 m)    Wt Readings from Last 3 Encounters:  05/21/17 139 lb (63 kg)  05/13/17 132 lb (59.9 kg)  05/06/17 132 lb (59.9 kg)   Physical Exam  Constitutional: She is oriented to person, place, and time. She appears well-developed and well-nourished.  HENT:  Head: Normocephalic and atraumatic.  Eyes: Conjunctivae and EOM are normal. Pupils are equal, round, and reactive to light.  Neck: Normal range of motion. Neck supple.  Cardiovascular: Normal rate, regular rhythm, normal heart sounds and intact distal pulses.   Pulmonary/Chest: Effort normal and breath sounds normal.  Abdominal: Soft. Bowel sounds are normal.  Musculoskeletal: Normal range of motion.  Neurological: She is alert and oriented to person, place, and time.  Skin: Skin is warm and dry.  Psychiatric: She has a normal mood and affect. Her behavior is normal. Judgment and thought content normal.   Assessment & Plan:  1. Anemia, unspecified type, Hemoglobin was stable at 9.1 at discharge 05/13/2017 Will recheck CBC, CMP, and iron panel today.  She should continue taken Megace and iron replacement as previously prescribed. She has a follow-up scheduled with gynecology on 05/30/2017 for evaluation of abnormal uterine bleeding. - CBC with Differential - COMPLETE METABOLIC PANEL WITH GFR - Iron, TIBC and Ferritin Panel  2. Screening for breast cancer - MM Digital Screening  RTC: Schedule a routine complete physical exam at your earliest convenience. You may follow up at any time for any acute complaints.   Godfrey PickKimberly S. Tiburcio PeaHarris, MSN, FNP-C [facility]  [address] [place], [place] [address] [phone]

## 2017-05-21 NOTE — Patient Instructions (Addendum)
To schedule your breast exam, please call [facility] at  Phone: [phone].  You will be notified of any abnormal labs. You may schedule schedule a complete physical exam at your convenience.

## 2017-05-22 LAB — IRON,TIBC AND FERRITIN PANEL
%SAT: 9 % — AB (ref 11–50)
Ferritin: 14 ng/mL (ref 10–232)
IRON: 42 ug/dL (ref 40–190)
TIBC: 478 ug/dL — AB (ref 250–450)

## 2017-05-22 LAB — CBC WITH DIFFERENTIAL/PLATELET
BASOS ABS: 76 {cells}/uL (ref 0–200)
Basophils Relative: 1 %
EOS PCT: 9 %
Eosinophils Absolute: 684 cells/uL — ABNORMAL HIGH (ref 15–500)
HCT: 34.4 % — ABNORMAL LOW (ref 35.0–45.0)
Hemoglobin: 9.8 g/dL — ABNORMAL LOW (ref 11.7–15.5)
LYMPHS PCT: 25 %
Lymphs Abs: 1900 cells/uL (ref 850–3900)
MCH: 20 pg — AB (ref 27.0–33.0)
MCHC: 28.5 g/dL — ABNORMAL LOW (ref 32.0–36.0)
MCV: 70.1 fL — AB (ref 80.0–100.0)
MONOS PCT: 10 %
MPV: 8.6 fL (ref 7.5–12.5)
Monocytes Absolute: 760 cells/uL (ref 200–950)
NEUTROS ABS: 4180 {cells}/uL (ref 1500–7800)
Neutrophils Relative %: 55 %
PLATELETS: 494 10*3/uL — AB (ref 140–400)
RBC: 4.91 MIL/uL (ref 3.80–5.10)
RDW: 27.7 % — AB (ref 11.0–15.0)
WBC: 7.6 10*3/uL (ref 3.8–10.8)

## 2017-05-30 ENCOUNTER — Encounter: Payer: Self-pay | Admitting: Obstetrics & Gynecology

## 2017-09-25 ENCOUNTER — Ambulatory Visit (INDEPENDENT_AMBULATORY_CARE_PROVIDER_SITE_OTHER): Payer: Self-pay | Admitting: Family Medicine

## 2017-09-25 ENCOUNTER — Encounter: Payer: Self-pay | Admitting: Family Medicine

## 2017-09-25 VITALS — BP 131/90 | HR 79 | Temp 98.2°F | Resp 16 | Ht 64.0 in | Wt 138.2 lb

## 2017-09-25 DIAGNOSIS — F4321 Adjustment disorder with depressed mood: Secondary | ICD-10-CM

## 2017-09-25 DIAGNOSIS — D509 Iron deficiency anemia, unspecified: Secondary | ICD-10-CM

## 2017-09-25 DIAGNOSIS — Z634 Disappearance and death of family member: Secondary | ICD-10-CM

## 2017-09-25 DIAGNOSIS — F4329 Adjustment disorder with other symptoms: Secondary | ICD-10-CM

## 2017-09-25 DIAGNOSIS — F332 Major depressive disorder, recurrent severe without psychotic features: Secondary | ICD-10-CM

## 2017-09-25 MED ORDER — TRAZODONE HCL 50 MG PO TABS: 25.0000 mg | ORAL_TABLET | Freq: Every evening | ORAL | 3 refills | Status: DC | PRN

## 2017-09-25 MED ORDER — ESCITALOPRAM OXALATE 10 MG PO TABS: 10.0000 mg | ORAL_TABLET | Freq: Every day | ORAL | 0 refills | Status: AC

## 2017-09-25 MED ORDER — GABAPENTIN 100 MG PO CAPS: 100.0000 mg | ORAL_CAPSULE | Freq: Two times a day (BID) | ORAL | 3 refills | Status: AC | PRN

## 2017-09-25 MED FILL — GABAPENTIN 100 MG CAPSULE: 100 | 22 days supply | Qty: 90 | Fill #0

## 2017-09-25 MED FILL — ESCITALOPRAM 10 MG TABLET: 10 | 30 days supply | Qty: 30 | Fill #0

## 2017-09-25 MED FILL — traZODone HCL 50 MG TABS: 50 | 30 days supply | Qty: 30 | Fill #0

## 2017-09-25 NOTE — Patient Instructions (Signed)
Major Depressive Disorder, Adult Major depressive disorder (MDD) is a mental health condition. MDD often makes you feel sad, hopeless, or helpless. MDD can also cause symptoms in your body. MDD can affect your:  Work.  School.  Relationships.  Other normal activities.  MDD can range from mild to very bad. It may occur once (single episode MDD). It can also occur many times (recurrent MDD). The main symptoms of MDD often include:  Feeling sad, depressed, or irritable most of the time.  Loss of interest.  MDD symptoms also include:  Sleeping too much or too little.  Eating too much or too little.  A change in your weight.  Feeling tired (fatigue) or having low energy.  Feeling worthless.  Feeling guilty.  Trouble making decisions.  Trouble thinking clearly.  Thoughts of suicide or harming others.  Feeling weak.  Feeling agitated.  Keeping yourself from being around other people (isolation).  Follow these instructions at home: Activity  Do these things as told by your doctor: ? Go back to your normal activities. ? Exercise regularly. ? Spend time outdoors. Alcohol  Talk with your doctor about how alcohol can affect your antidepressant medicines.  Do not drink alcohol. Or, limit how much alcohol you drink. ? This means no more than 1 drink a day for nonpregnant women and 2 drinks a day for men. One drink equals one of these:  12 oz of beer.  5 oz of wine.  1 oz of hard liquor. General instructions  Take over-the-counter and prescription medicines only as told by your doctor.  Eat a healthy diet.  Get plenty of sleep.  Find activities that you enjoy. Make time to do them.  Think about joining a support group. Your doctor may be able to suggest a group for you.  Keep all follow-up visits as told by your doctor. This is important. Where to find more information:  The [employer] on Mental Illness: ? www.nami.org  U.[employer]  of Mental Health: ? http://www.maynard.net/www.nimh.nih.gov  National Suicide Prevention Lifeline: ? (918)504-97931-787-692-2901. This is free, 24-hour help. Contact a doctor if:  Your symptoms get worse.  You have new symptoms. Get help right away if:  You self-harm.  You see, hear, taste, smell, or feel things that are not present (hallucinate). If you ever feel like you may hurt yourself or others, or have thoughts about taking your own life, get help right away. You can go to your nearest emergency department or call:  Your local emergency services (911 in the [place]  A suicide crisis helpline, such as the National Suicide Prevention Lifeline: ? (740)021-95791-787-692-2901. This is open 24 hours a day.  This information is not intended to replace advice given to you by your health care provider. Make sure you discuss any questions you have with your health care provider. Document Released: 10/31/2015 Document Revised: 08/05/2016 Document Reviewed: 08/05/2016 Elsevier Interactive Patient Education  2017 [employer]   Complicated Grieving Grief is a normal response to the death of someone close to you. Feelings of fear, anger, and guilt can affect almost everyone who loses a loved one. It is also common to have symptoms of depression while you are grieving. These include problems with sleep, loss of appetite, and lack of energy. They may last for weeks or months after a loss. Complicated grief is different from normal grief or depression. Normal grieving involves sadness and feelings of loss, but these feelings are not constant. Complicated grief is a constant and  severe type of grief. It interferes with your ability to function normally. It may last for several months to a year or longer. Complicated grief may require treatment from a mental health care provider. What are the causes? It is not known why some people continue to struggle with grief and others do not. You may be at higher risk for complicated grief if:  The  death of your loved one was sudden or unexpected.  The death of your loved one was due to a violent event.  Your loved one committed suicide.  Your loved one was a child or a young person.  You were very close to or dependent on the loved one.  You have a history of depression.  What are the signs or symptoms? Signs and symptoms of complicated grief may include:  Feeling disbelief or numbness.  Being unable to enjoy good memories of your loved one.  Needing to avoid anything that reminds you of your loved one.  Being unable to stop thinking about the death.  Feeling intense anger or guilt.  Feeling alone and hopeless.  Feeling that your life is meaningless and empty.  Losing the desire to live.  How is this diagnosed? Your health care provider may diagnose complicated grief if:  You have constant symptoms of grief for 6-12 months or longer.  Your symptoms are interfering with your ability to live your life.  Your health care provider may want you to see a mental health care provider. Many symptoms of depression are similar to the symptoms of complicated grief. It is important to be evaluated for complicated grief along with other mental health conditions. How is this treated? Talk therapy with a mental health provider is the most common treatment for complicated grief. During therapy, you will learn healthy ways to cope with the loss of your loved one. In some cases, your mental health care provider may also recommend antidepressant medicines. Follow these instructions at home:  Take care of yourself. ? Eat regular meals and maintain a healthy diet. Eat plenty of fruits, vegetables, and whole grains. ? Try to get some exercise each day. ? Keep regular hours for sleep. Try to get at least 8 hours of sleep each night.  Do not use drugs or alcohol to ease your symptoms.  Take medicines only as directed by your health care provider.  Spend time with friends and loved  ones.  Consider joining a grief (bereavement) support group to help you deal with your loss.  Keep all follow-up visits as directed by your health care provider. This is important. Contact a health care provider if:  Your symptoms keep you from functioning normally.  Your symptoms do not get better with treatment. Get help right away if:  You have serious thoughts of hurting yourself or someone else.  You have suicidal feelings. This information is not intended to replace advice given to you by your health care provider. Make sure you discuss any questions you have with your health care provider. Document Released: 11/19/2005 Document Revised: 04/26/2016 Document Reviewed: 04/29/2014 Elsevier Interactive Patient Education  [employer].

## 2017-09-25 NOTE — Progress Notes (Signed)
Patient ID: Diana Mcdowell, female    DOB: 02/07/1976, 41 y.o.   MRN: [id number]  PCP: Bing Neighbors, FNP  Chief Complaint  Patient presents with  . Follow-up    sleeping, headaches    Subjective:  HPI Diana Mcdowell is a 41 y.o. female presents for a complete physical exam. She was last seen in clinic for a hospital follow-up after experiencing a GI bleed from chronic NSAID use. Medical problems include chronic everyday smoker, iron deficiency anemia, and depression with anxiety secondary to persistent grief over the death of her son.   She reports that she is having headaches occurring almost 3-4 days/week, difficulty sleeping, persistent crying, and decreased appetite.  Over the last month she is missed several days of work due to uncontrollable crying.  She stays in her room while at home and does not interact with her 2 daughters.  Her daughters urged her to come in today for help as her depression has taken over.  She denies any suicidal or homicidal thoughts today.  Although endorses feelings that life would be better if she was not here.  She reports that the pain of losing her son is unbearable.  She reports that the pain is so severe that she sometimes hurts physically due to the grief of missing her son.  She was encouraged to go to grief counseling during her last office visit, however she reports that she does not feel comfortable talking in a group setting or with other people as no one understands what she is going through.  Diana Mcdowell does admit that she something has to change as she is morning to go back to work and wanting to have more energy to interact with her daughters.  She was prescribed hydroxyzine for anxiety and rest, however reports that she is still unable to sleep throughout the night. Haylo has not had any episodes of GI bleeding and continues to take iron supplements. Social History   Social History  . Marital status: Single    Spouse name: N/A  . Number of  children: N/A  . 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 Smoking status: Current Every Day Smoker    Packs/day: 0.50    Types: Cigarettes  . Smokeless tobacco: Never Used  . Alcohol use No  . Drug use: No  . Sexual activity: Not on file   Other Topics Concern  . Not on file   Social History Narrative  . No narrative on file    Family History  Problem Relation Age of Onset  . Hypertension Mother   . Diabetes Mother   . Cancer Maternal Grandfather   . Diabetes Paternal Grandmother    Review of Systems  Constitutional: Positive for appetite change.  Neurological: Positive for headaches.  Psychiatric/Behavioral: Positive for decreased concentration, dysphoric mood and sleep disturbance. Negative for self-injury. The patient is nervous/anxious.     Patient Active Problem List   Diagnosis Date Noted  . Menorrhagia with irregular cycle 05/13/2017  . 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 Chronic headaches 05/13/2017  . Melena   . Iron deficiency anemia due to chronic blood loss - metromenorrhagia and GI bleeding from NSAID's   . Acute blood loss anemia 05/12/2017  . Insomnia 05/12/2017  . GI bleed 05/11/2017    No Known Allergies  Prior to Admission medications   Medication Sig Start Date End Date Taking? Authorizing Provider  acetaminophen (TYLENOL) 325 MG tablet Take 2 tablets (650 mg total) by mouth every 6 (six) hours as needed for mild pain (or Fever >/= 101). 05/13/17   Elgergawy, Leana Roeawood S, MD  ferrous sulfate 325 (65 FE) MG tablet Take 1 tablet (325 mg total) by mouth 2 (two) times daily with a meal. 05/13/17   Elgergawy, Leana Roeawood S, MD  hydrOXYzine (ATARAX/VISTARIL) 50 MG tablet Take 2 tablets (100 mg total) by mouth at bedtime as needed and may repeat dose one time if needed for itching. 05/21/17   Bing NeighborsHarris, Blaise Grieshaber S, FNP  megestrol (MEGACE) 40 MG tablet Take 1 tablet (40 mg total) by mouth 2  (two) times daily. 05/13/17   Elgergawy, Leana Roeawood S, MD  pantoprazole (PROTONIX) 40 MG tablet Take 1 tablet (40 mg total) by mouth daily before breakfast. 05/14/17   Elgergawy, Leana Roeawood S, MD    Past Medical, Surgical Family and Social History reviewed and updated.    Objective:   Depression screen [facility] 2/9 09/25/2017 05/21/2017  Decreased Interest 3 0  Down, Depressed, Hopeless 3 0  PHQ - 2 Score 6 0  Altered sleeping 3 -  Tired, decreased energy 3 -  Change in appetite 2 -  Feeling bad or failure about yourself  3 -  Trouble concentrating 2 -  Moving slowly or fidgety/restless 0 -  Suicidal thoughts 0 -  PHQ-9 Score 19 -   Today's Vitals   09/25/17 1045  BP: 131/90  Pulse: 79  Resp: 16  Temp: 98.2 F (36.8 C)  TempSrc: Oral  SpO2: 99%  Weight: 138 lb 3.2 oz (62.7 kg)  Height: 5\' 4"  (1.626 m)    Wt Readings from Last 3 Encounters:  09/25/17 138 lb 3.2 oz (62.7 kg)  05/21/17 139 lb (63 kg)  05/13/17 132 lb (59.9 kg)   Physical Exam  Constitutional: She is oriented to person, place, and time. She appears well-developed and well-nourished.  HENT:  Head: Normocephalic and atraumatic.  Eyes: Pupils are equal, round, and reactive to light.  Neck: Normal range of motion. Neck supple.  Cardiovascular: Normal rate, regular rhythm, normal heart sounds and intact distal pulses.   Pulmonary/Chest: Effort normal and breath sounds normal.  Abdominal: Soft. Bowel sounds are normal. She exhibits no distension and no mass. There is no tenderness. There is no rebound and no guarding.  Neurological: She is alert and oriented to person, place, and time.  Skin: Skin is warm and dry.  Psychiatric: Her speech is normal and behavior is normal. Judgment and thought content normal. Her mood appears anxious. Cognition and memory are normal. She exhibits a depressed mood.   Assessment & Plan:  1. Severe episode of recurrent major depressive disorder, without psychotic features (HCC), PHQ-9 score  today is 19.  Patient is visibly depressed, tearful, and having difficulty managing grief.  She is not open to counseling at this time. However is in agreement to return to office for periodic follow-ups to manage and evaluate depression symptoms. I will start her today on an SSRI as well as trial trazodone to help with depression and to induce sleep. Added Gabapentin as a mood stabilizer.   2. Complicated grieving -Patient encouraged to talk with family and close friends about her son and his memory.  She is encouraged to look at pictures and discuss happier times with her daughters.  Again patient was encouraged to reach out to hospice services for counseling, however she declines.  3. Iron deficiency anemia, unspecified iron deficiency anemia  type -Continue iron supplement.    Meds ordered this encounter  Medications  . escitalopram (LEXAPRO) 10 MG tablet    Sig: Take 1 tablet (10 mg total) by mouth daily.    Dispense:  30 tablet    Refill:  0    Order Specific Question:   Supervising Provider    Answer:   Quentin Angst [id number]  . traZODone (DESYREL) 50 MG tablet    Sig: Take 0.5-1 tablets (25-50 mg total) by mouth at bedtime as needed for sleep.    Dispense:  30 tablet    Refill:  3    Order Specific Question:   Supervising Provider    Answer:   Quentin Angst [id number]  . gabapentin (NEURONTIN) 100 MG capsule    Sig: Take 1-2 capsules (100-200 mg total) by mouth 2 (two) times daily as needed.    Dispense:  90 capsule    Refill:  3    Order Specific Question:   Supervising Provider    Answer:   Quentin Angst [[id number]]    Return for care 3 weeks for depression evaluation.  A total of 30 minutes spent, greater than 50 % of this time was spent reviewing prior history of present symptoms, reviewing medications and indications of treatment, discussing current plan of treatment, mental health promotion, and goals of treatment.   Godfrey Pick. Tiburcio Pea, MSN,  FNP-C [facility]  [address] [place], [place] [address] [phone]

## 2017-10-16 ENCOUNTER — Ambulatory Visit: Payer: No Typology Code available for payment source | Admitting: Family Medicine

## 2018-06-04 ENCOUNTER — Encounter: Payer: Self-pay | Admitting: Family Medicine

## 2018-06-04 ENCOUNTER — Ambulatory Visit (INDEPENDENT_AMBULATORY_CARE_PROVIDER_SITE_OTHER): Payer: Self-pay | Admitting: Family Medicine

## 2018-06-04 VITALS — BP 126/90 | HR 94 | Temp 97.9°F | Ht 64.0 in | Wt 134.0 lb

## 2018-06-04 DIAGNOSIS — D509 Iron deficiency anemia, unspecified: Secondary | ICD-10-CM

## 2018-06-04 DIAGNOSIS — R630 Anorexia: Secondary | ICD-10-CM

## 2018-06-04 DIAGNOSIS — Z0000 Encounter for general adult medical examination without abnormal findings: Secondary | ICD-10-CM

## 2018-06-04 DIAGNOSIS — Z09 Encounter for follow-up examination after completed treatment for conditions other than malignant neoplasm: Secondary | ICD-10-CM

## 2018-06-04 DIAGNOSIS — R8290 Unspecified abnormal findings in urine: Secondary | ICD-10-CM

## 2018-06-04 DIAGNOSIS — H539 Unspecified visual disturbance: Secondary | ICD-10-CM

## 2018-06-04 DIAGNOSIS — G4700 Insomnia, unspecified: Secondary | ICD-10-CM

## 2018-06-04 DIAGNOSIS — F419 Anxiety disorder, unspecified: Secondary | ICD-10-CM

## 2018-06-04 DIAGNOSIS — I10 Essential (primary) hypertension: Secondary | ICD-10-CM

## 2018-06-04 DIAGNOSIS — K219 Gastro-esophageal reflux disease without esophagitis: Secondary | ICD-10-CM

## 2018-06-04 LAB — POCT URINALYSIS DIP (MANUAL ENTRY)
Bilirubin, UA: NEGATIVE
Glucose, UA: NEGATIVE mg/dL
Ketones, POC UA: NEGATIVE mg/dL
Leukocytes, UA: NEGATIVE
Nitrite, UA: NEGATIVE
Protein Ur, POC: NEGATIVE mg/dL
Spec Grav, UA: >=1.03 — AB (ref 1.010–1.025)
Urobilinogen, UA: 0.2 E.U./dL
pH, UA: 5 (ref 5.0–8.0)

## 2018-06-04 NOTE — Progress Notes (Signed)
Subjective:    Patient ID: Diana Mcdowell, female    DOB: 02-26-1976, 42 y.o.   MRN: [id number]   PCP: Diana Ip, NP  Chief Complaint  Patient presents with  . Dizziness  . Blurred Vision  . Hypertension  . Headache    HPI  Diana Mcdowell has a past medical history of Migraines, Menorrhagia, Iron Deficiency Anemia, and Chronic Headaches. She is here today for follow up.   Current Status: Since her last office visit on 09/25/2017, she continues to mourn the death of her 23 year son, who died almost 3 years ago. Her anxiety has improved since then. She states that it comes and goes. She has not been taking any of her prescribed medications.   She denies fevers, chills, fatigue, recent infections, and weight loss.   She states that she has hot flashes daily. Her vision is changing and she experiences blurriness at times. She has not had any headaches, dizziness, and falls. She has occasional chest pain, which she r/t her anxiety with the death of her son.   No heart palpitations, cough and shortness of breath reported.   She has mild abdominal pain and decreased appetite, which she r/t her anxiety with the death of her son. No reports of GI problems such as nausea, vomiting, diarrhea, and constipation. She has no reports of blood in stools, dysuria and hematuria.   She has moderate anxiety today. She denies suicidal ideations, homicidal ideations, or auditory hallucinations. He has been referred to psychiatry in the past, but did not return for follow up.   States that she continues to have increased insomnia, but is currently not taking any medication to help with sleep. She does not like the side effects of Lexapro.   She denies pain today.   Past Medical History:  Diagnosis Date  . Anemia   . Chronic headaches 05/13/2017  . 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 Iron deficiency anemia due to chronic blood loss - metromenorrhagia and GI bleeding from NSAID's    . Iron deficiency anemia due to chronic blood loss - metromenorrhagia and GI bleeding from NSAID's   . Menorrhagia with irregular cycle 05/13/2017  . Migraines     Family History  Problem Relation Age of Onset  . Hypertension Mother   . Diabetes Mother   . Cancer Maternal Grandfather   . Diabetes Paternal Grandmother     Social History   Socioeconomic History  . Marital status: Single    Spouse name: Not on file  . Number of children: Not on file  . Years of education: Not on file  . Highest education level: Not on file  Occupational History  . Occupation: Lobbyist: [employer]  Social Needs  . Financial resource strain: Not on file  . Food insecurity:    Worry: Not on file    Inability: Not on file  . Transportation needs:    Medical: Not on file    Non-medical: Not on file  Tobacco Use  . Smoking status: Current Every Day Smoker    Packs/day: 0.50    Types: Cigarettes  . Smokeless tobacco: Never Used  Substance and Sexual Activity  . Alcohol use: No  . Drug use: No  . Sexual activity: Not on file  Lifestyle  . Physical activity:    Days per week: Not on file    Minutes per session: Not on file  . Stress: Not on file  Relationships  .  Social connections:    Talks on phone: Not on file    Gets together: Not on file    Attends religious service: Not on file    Active member of club or organization: Not on file    Attends meetings of clubs or organizations: Not on file    Relationship status: Not on file  . Intimate partner violence:    Fear of current or ex partner: Not on file    Emotionally abused: Not on file    Physically abused: Not on file    Forced sexual activity: Not on file  Other Topics Concern  . Not on file  Social History Narrative  . Not on file    Past Surgical History:  Procedure Laterality Date  . ESOPHAGOGASTRODUODENOSCOPY N/A 05/13/2017   Procedure: ESOPHAGOGASTRODUODENOSCOPY (EGD);  Surgeon: Iva BoopGessner, Carl E, MD;  [facility];  Service: Endoscopy;  Laterality: N/A;  . TUBAL LIGATION  2002     There is no immunization history on file for this patient.  No outpatient medications have been marked as taking for the 06/04/18 encounter (Office Visit) with Kallie LocksStroud, Stevie Ertle M, FNP.   No Known Allergies  BP 126/90 (BP Location: Left Arm, Patient Position: Sitting, Cuff Size: Small)   Pulse 94   Temp 97.9 F (36.6 C) (Oral)   Ht 5\' 4"  (1.626 m)   Wt 134 lb (60.8 kg)   LMP 05/30/2018   SpO2 100%   BMI 23.00 kg/m   Review of Systems  Constitutional: Negative.   HENT: Negative.   Eyes: Negative.        Recent visual changes.  Eye test revealed: -20/70 in right eye  -20/25 vision in right eye.   Respiratory: Positive for shortness of breath.        Chest discomfort r/t anxiety. Shortness of breath r/t anxiety.  Cardiovascular: Negative.   Gastrointestinal: Positive for abdominal pain.       Mild abdominal pain r/t anxiety. Loss of appetite r/t anxiety.  Endocrine: Negative.   Genitourinary: Negative.   Musculoskeletal: Negative.   Skin: Negative.   Allergic/Immunologic: Negative.   Neurological: Negative.        Hot flashes  Hematological: Negative.   Psychiatric/Behavioral: Positive for sleep disturbance.       Insomnia r/t to anxiety.   Objective:   Physical Exam  Constitutional: She is oriented to person, place, and time. She appears well-developed and well-nourished.  HENT:  Head: Normocephalic and atraumatic.  Mouth/Throat: Oropharynx is clear and moist.  Eyes: Pupils are equal, round, and reactive to light. EOM are normal.  Neck: Normal range of motion. Neck supple.  Cardiovascular: Normal rate, regular rhythm, normal heart sounds and intact distal pulses.  Pulmonary/Chest: Effort normal and breath sounds normal.  Abdominal: Soft. Bowel sounds are normal.  Musculoskeletal: Normal range of motion.  Neurological: She is alert and oriented to person, place, and time.  Skin: Skin  is warm and dry. Capillary refill takes less than 2 seconds.  Psychiatric: She has a normal mood and affect. Her behavior is normal.  Mild Anxiety/Depression r/t loss of son.  Nursing note and vitals reviewed.  Assessment & Plan:   1. Hypertension, unspecified type Blood pressure is 126/90 today. We will continue to monitor and possibly initiate antihypertensive if warranted. Monitor.  - POCT urinalysis dipstick  2. Iron deficiency anemia, unspecified iron deficiency anemia type Hgb at 9.1 on 05/13/2018. She will resume taking oral Iron pills. We will re-evaluate today.  3. Health care maintenance - CBC with Differential - Comprehensive metabolic panel - TSH - Lipid Panel  4. Abnormal finding on urinalysis - Urine Culture  5. Vision changes System currently not taking referrals for our patients for Optometry at this time, referral may re-open in August. She is advised to get eye exam from walk-in eye exam facility like [facility]. Verbalizes understanding.   6. Anxiety Stable. We will refer her to psychiatry today. She is aware of services at [facility].  - traZODone (DESYREL) 50 MG tablet; Take 1 tablet (50 mg total) by mouth at bedtime.  Dispense: 30 tablet; Refill: 1  7. Loss of appetite Re-initiate Megace today. She will also eat 5-6 smaller meals daily.  - megestrol (MEGACE) 40 MG tablet; Take 1 tablet (40 mg total) by mouth 2 (two) times daily.  Dispense: 60 tablet; Refill: 1  8. Gastroesophageal reflux disease without esophagitis We will refill Protonix. - pantoprazole (PROTONIX) 40 MG tablet; Take 1 tablet (40 mg total) by mouth daily before breakfast.  Dispense: 30 tablet; Refill: 1  9. Insomnia, unspecified type We will re-initiate Trazodone today. She will contact office if she has undesirable side effects.  - traZODone (DESYREL) 50 MG tablet; Take 1 tablet (50 mg total) by mouth at bedtime.  Dispense: 30 tablet; Refill: 1  10. Follow up She will follow up  in 1 month.  Meds ordered this encounter  Medications  . megestrol (MEGACE) 40 MG tablet    Sig: Take 1 tablet (40 mg total) by mouth 2 (two) times daily.    Dispense:  60 tablet    Refill:  1  . pantoprazole (PROTONIX) 40 MG tablet    Sig: Take 1 tablet (40 mg total) by mouth daily before breakfast.    Dispense:  30 tablet    Refill:  1  . traZODone (DESYREL) 50 MG tablet    Sig: Take 1 tablet (50 mg total) by mouth at bedtime.    Dispense:  30 tablet    Refill:  1   Diana Ip,  MSN, FNP-BC Patient [facility] [address] [place], [place] [address] [phone]

## 2018-06-05 LAB — COMPREHENSIVE METABOLIC PANEL
ALT: 6 IU/L (ref 0–32)
AST: 13 IU/L (ref 0–40)
Albumin/Globulin Ratio: 1.8 (ref 1.2–2.2)
Albumin: 4.8 g/dL (ref 3.5–5.5)
Alkaline Phosphatase: 40 IU/L (ref 39–117)
BUN/Creatinine Ratio: 13 (ref 9–23)
BUN: 10 mg/dL (ref 6–24)
Bilirubin Total: <0.2 mg/dL (ref 0.0–1.2)
CO2: 23 mmol/L (ref 20–29)
Calcium: 9.5 mg/dL (ref 8.7–10.2)
Chloride: 104 mmol/L (ref 96–106)
Creatinine, Ser: 0.78 mg/dL (ref 0.57–1.00)
GFR calc Af Amer: 108 mL/min/{1.73_m2} (ref >59)
GFR calc non Af Amer: 94 mL/min/{1.73_m2} (ref >59)
Globulin, Total: 2.7 g/dL (ref 1.5–4.5)
Glucose: 82 mg/dL (ref 65–99)
Potassium: 4.2 mmol/L (ref 3.5–5.2)
Sodium: 141 mmol/L (ref 134–144)
Total Protein: 7.5 g/dL (ref 6.0–8.5)

## 2018-06-05 LAB — CBC WITH DIFFERENTIAL/PLATELET
Basophils Absolute: 0 10*3/uL (ref 0.0–0.2)
Basos: 1 %
EOS (ABSOLUTE): 0.1 10*3/uL (ref 0.0–0.4)
Eos: 1 %
Hematocrit: 38.9 % (ref 34.0–46.6)
Hemoglobin: 12.3 g/dL (ref 11.1–15.9)
Immature Grans (Abs): 0 10*3/uL (ref 0.0–0.1)
Immature Granulocytes: 0 %
Lymphocytes Absolute: 1.7 10*3/uL (ref 0.7–3.1)
Lymphs: 27 %
MCH: 28.3 pg (ref 26.6–33.0)
MCHC: 31.6 g/dL (ref 31.5–35.7)
MCV: 89 fL (ref 79–97)
Monocytes Absolute: 0.5 10*3/uL (ref 0.1–0.9)
Monocytes: 8 %
Neutrophils Absolute: 3.9 10*3/uL (ref 1.4–7.0)
Neutrophils: 63 %
Platelets: 491 10*3/uL — ABNORMAL HIGH (ref 150–450)
RBC: 4.35 x10E6/uL (ref 3.77–5.28)
RDW: 14.3 % (ref 12.3–15.4)
WBC: 6.2 10*3/uL (ref 3.4–10.8)

## 2018-06-05 LAB — LIPID PANEL
Chol/HDL Ratio: 2.8 ratio (ref 0.0–4.4)
Cholesterol, Total: 167 mg/dL (ref 100–199)
HDL: 59 mg/dL (ref >39)
LDL Calculated: 98 mg/dL (ref 0–99)
Triglycerides: 52 mg/dL (ref 0–149)
VLDL Cholesterol Cal: 10 mg/dL (ref 5–40)

## 2018-06-05 LAB — TSH: TSH: 0.648 u[IU]/mL (ref 0.450–4.500)

## 2018-06-05 MED ORDER — MEGESTROL ACETATE 40 MG PO TABS: 40.0000 mg | ORAL_TABLET | Freq: Two times a day (BID) | ORAL | 1 refills | Status: AC

## 2018-06-05 MED ORDER — TRAZODONE HCL 50 MG PO TABS
50.0000 mg | ORAL_TABLET | Freq: Every day | ORAL | 1 refills | Status: AC
Start: 2018-06-05 — End: ?

## 2018-06-05 MED ORDER — PANTOPRAZOLE SODIUM 40 MG PO TBEC: 40.0000 mg | DELAYED_RELEASE_TABLET | Freq: Every day | ORAL | 1 refills | Status: AC

## 2018-06-06 LAB — URINE CULTURE

## 2018-06-17 ENCOUNTER — Telehealth: Payer: Self-pay

## 2018-06-17 NOTE — Telephone Encounter (Signed)
Patient notified

## 2018-06-17 NOTE — Telephone Encounter (Signed)
-----   Message from Kallie LocksNatalie M Stroud, FNP sent at 06/16/2018  8:33 PM EDT ----- Regarding: "Lab Results" Lyla Sonarrie,   Please inform patient that all lab results are stable.   She will need to keep follow up appointment 07/2018.   Thanks.

## 2018-07-09 ENCOUNTER — Ambulatory Visit: Payer: No Typology Code available for payment source | Admitting: Family Medicine

## 2019-09-11 ENCOUNTER — Encounter: Payer: Self-pay | Admitting: Family Medicine

## 2019-09-11 ENCOUNTER — Ambulatory Visit (INDEPENDENT_AMBULATORY_CARE_PROVIDER_SITE_OTHER): Payer: No Typology Code available for payment source | Admitting: Family Medicine

## 2019-09-11 ENCOUNTER — Other Ambulatory Visit: Payer: Self-pay

## 2019-09-11 VITALS — BP 127/90 | HR 72 | Ht 64.0 in | Wt 137.6 lb

## 2019-09-11 DIAGNOSIS — Z634 Disappearance and death of family member: Secondary | ICD-10-CM | POA: Insufficient documentation

## 2019-09-11 DIAGNOSIS — N644 Mastodynia: Secondary | ICD-10-CM | POA: Insufficient documentation

## 2019-09-11 DIAGNOSIS — Z803 Family history of malignant neoplasm of breast: Secondary | ICD-10-CM | POA: Insufficient documentation

## 2019-09-11 DIAGNOSIS — N6310 Unspecified lump in the right breast, unspecified quadrant: Secondary | ICD-10-CM | POA: Insufficient documentation

## 2019-09-11 DIAGNOSIS — Z09 Encounter for follow-up examination after completed treatment for conditions other than malignant neoplasm: Secondary | ICD-10-CM

## 2019-09-11 DIAGNOSIS — I10 Essential (primary) hypertension: Secondary | ICD-10-CM | POA: Insufficient documentation

## 2019-09-11 DIAGNOSIS — N926 Irregular menstruation, unspecified: Secondary | ICD-10-CM

## 2019-09-11 DIAGNOSIS — F419 Anxiety disorder, unspecified: Secondary | ICD-10-CM | POA: Insufficient documentation

## 2019-09-11 LAB — POCT URINE PREGNANCY: Preg Test, Ur: NEGATIVE

## 2019-09-11 NOTE — Progress Notes (Signed)
[facility]   Annual Physical  Subjective:  Patient ID: Diana Mcdowell, female    DOB: Sep 18, 1976  Age: 43 y.o. MRN: [id number]  CC:  Chief Complaint  Patient presents with  . Breast Pain    Rt brest pain, lump first noticed 10 days ago    HPI Diana Mcdowell is a 43 year old female who presents for Sick Visit today.   Past Medical History:  Diagnosis Date  . Anemia   . Chronic headaches 05/13/2017  . 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 Iron deficiency anemia due to chronic blood loss - metromenorrhagia and GI bleeding from NSAID's   . Iron deficiency anemia due to chronic blood loss - metromenorrhagia and GI bleeding from NSAID's   . Menorrhagia with irregular cycle 05/13/2017  . Migraines    Current Status: Since her last office visit, she is doing well with no complaints. She has c/o a breast lump X 10 days ago. She experiences pain and tenderness in right breast area at breast lump site and also near the area. She decided to contact her Parental family members and found out that a few of her Aunts and Grandmother all had Breast Cancer. Since she has a strong history of breast cancer, she decided to get further evaluation on her breast lump. She denies visual changes, chest pain, cough, shortness of breath, heart palpitations, and falls. She has occasional headaches and dizziness with position changes. Denies severe headaches, confusion, seizures, double vision, and blurred vision, nausea and vomiting. Her anxiety is increased r/t the loss of her son 4 years ago. She has not followed up with Pychiatry as of yet. She denies suicidal ideations, homicidal ideations, or auditory hallucinations. She denies fevers, chills, fatigue, recent infections, weight loss, and night sweats.  No reports of GI problems such as  diarrhea, and constipation. She has no reports of blood in stools, dysuria and hematuria. She denies pain  today.   Past Surgical History:  Procedure Laterality Date  . ESOPHAGOGASTRODUODENOSCOPY N/A 05/13/2017   Procedure: ESOPHAGOGASTRODUODENOSCOPY (EGD);  Surgeon: Iva Boop, MD;  Location: [facility] ENDOSCOPY;  Service: Endoscopy;  Laterality: N/A;  . TUBAL LIGATION  2002    Family History  Problem Relation Age of Onset  . Hypertension Mother   . Diabetes Mother   . Cancer Maternal Grandfather   . Diabetes Paternal Grandmother   . Breast cancer Paternal Grandmother     Social History   Socioeconomic History  . Marital status: Single    Spouse name: Not on file  . Number of children: Not on file  . Years of education: Not on file  . Highest education level: Not on file  Occupational History  . Occupation: Lobbyist: [employer]  Social Needs  . Financial resource strain: Not on file  . Food insecurity    Worry: Not on file    Inability: Not on file  . Transportation needs    Medical: Not on file    Non-medical: Not on file  Tobacco Use  . Smoking status: Current Every Day Smoker    Packs/day: 0.50    Types: Cigarettes  . Smokeless tobacco: Never Used  Substance and Sexual Activity  . Alcohol use: No  . Drug use: No  . Sexual activity: Not on file  Lifestyle  . Physical activity    Days per week: Not on file    Minutes per session: Not  on file  . Stress: Not on file  Relationships  . Social Herbalist on phone: Not on file    Gets together: Not on file    Attends religious service: Not on file    Active member of club or organization: Not on file    Attends meetings of clubs or organizations: Not on file    Relationship status: Not on file  . Intimate partner violence    Fear of current or ex partner: Not on file    Emotionally abused: Not on file    Physically abused: Not on file    Forced sexual activity: Not on file  Other Topics Concern  . Not on file  Social History Narrative  . Not on file    Outpatient Medications Prior to Visit   Medication Sig Dispense Refill  . escitalopram (LEXAPRO) 10 MG tablet Take 1 tablet (10 mg total) by mouth daily. (Patient not taking: Reported on 06/04/2018) 30 tablet 0  . ferrous sulfate 325 (65 FE) MG tablet Take 1 tablet (325 mg total) by mouth 2 (two) times daily with a meal. (Patient not taking: Reported on 09/25/2017) 60 tablet 0  . gabapentin (NEURONTIN) 100 MG capsule Take 1-2 capsules (100-200 mg total) by mouth 2 (two) times daily as needed. (Patient not taking: Reported on 06/04/2018) 90 capsule 3  . hydrOXYzine (ATARAX/VISTARIL) 50 MG tablet Take 2 tablets (100 mg total) by mouth at bedtime as needed and may repeat dose one time if needed for itching. (Patient not taking: Reported on 09/25/2017) 60 tablet 1  . megestrol (MEGACE) 40 MG tablet Take 1 tablet (40 mg total) by mouth 2 (two) times daily. 60 tablet 1  . pantoprazole (PROTONIX) 40 MG tablet Take 1 tablet (40 mg total) by mouth daily before breakfast. 30 tablet 1  . traZODone (DESYREL) 50 MG tablet Take 1 tablet (50 mg total) by mouth at bedtime. 30 tablet 1   No facility-administered medications prior to visit.     No Known Allergies  ROS Review of Systems  Constitutional: Negative.   HENT: Negative.   Eyes: Negative.   Respiratory: Negative.   Cardiovascular: Negative.   Gastrointestinal: Negative.   Endocrine: Negative.   Genitourinary: Negative.   Skin: Negative.   Allergic/Immunologic: Negative.   Neurological: Positive for dizziness and headaches.  Hematological: Negative.   Psychiatric/Behavioral: Negative.       Objective:    Physical Exam  Constitutional: She is oriented to person, place, and time. She appears well-developed and well-nourished.  HENT:  Head: Normocephalic and atraumatic.  Eyes: Conjunctivae are normal.  Neck: Normal range of motion. Neck supple.  Cardiovascular: Normal rate, normal heart sounds and intact distal pulses.  Pulmonary/Chest: Effort normal and breath sounds normal.     Right breast mass measures approximately 3 X 3 inches. Tender to touch. Reports that lump feels 'heavy'  Abdominal: Soft. Bowel sounds are normal.  Musculoskeletal: Normal range of motion.  Neurological: She is alert and oriented to person, place, and time. She has normal reflexes.  Skin: Skin is warm and dry.  Psychiatric:  Tearful r/t death of her son.   Nursing note and vitals reviewed.   BP 127/90 (BP Location: Left Arm, Patient Position: Sitting, Cuff Size: Normal)   Pulse 72   Ht 5\' 4"  (1.626 m)   Wt 137 lb 9.6 oz (62.4 kg)   SpO2 98%   BMI 23.62 kg/m  Wt Readings from Last 3 Encounters:  09/11/19  137 lb 9.6 oz (62.4 kg)  06/04/18 134 lb (60.8 kg)  09/25/17 138 lb 3.2 oz (62.7 kg)     Health Maintenance Due  Topic Date Due  . TETANUS/TDAP  12/01/1994  . PAP SMEAR-Modifier  12/01/1996  . INFLUENZA VACCINE  07/04/2019    There are no preventive care reminders to display for this patient.  Lab Results  Component Value Date   TSH 0.648 06/04/2018   Lab Results  Component Value Date   WBC 6.2 06/04/2018   HGB 12.3 06/04/2018   HCT 38.9 06/04/2018   MCV 89 06/04/2018   PLT 491 (H) 06/04/2018   Lab Results  Component Value Date   NA 141 06/04/2018   K 4.2 06/04/2018   CO2 23 06/04/2018   GLUCOSE 82 06/04/2018   BUN 10 06/04/2018   CREATININE 0.78 06/04/2018   BILITOT <0.2 06/04/2018   ALKPHOS 40 06/04/2018   AST 13 06/04/2018   ALT 6 06/04/2018   PROT 7.5 06/04/2018   ALBUMIN 4.8 06/04/2018   CALCIUM 9.5 06/04/2018   ANIONGAP 8 05/12/2017   Lab Results  Component Value Date   CHOL 167 06/04/2018   Lab Results  Component Value Date   HDL 59 06/04/2018   Lab Results  Component Value Date   LDLCALC 98 06/04/2018   Lab Results  Component Value Date   TRIG 52 06/04/2018   Lab Results  Component Value Date   CHOLHDL 2.8 06/04/2018   No results found for: HGBA1C    Assessment & Plan:   1. Lump of right breast We will contact Fonnie Mu with [facility] program to possibly get patient enrolled for needed services - MM Digital Diagnostic Bilat; Future  2. Breast tenderness in female  3. Breast pain, right  4. Family history of breast cancer in female - MM Digital Diagnostic Bilat; Future  5. Anxiety Increased today. Patient to contact office if needed. We will continue to monitor.   6. Sudden death of family member Recent death of son, age 54 years due to gun violence.   7. Death of family member  8. Irregular menstrual cycle - POCT urine pregnancy  9. Hypertension, unspecified type Blood pressure is stable at 127/90 today. She will continue to take medications as prescribed, to decrease high sodium intake, excessive alcohol intake, increase potassium intake, smoking cessation, and increase physical activity of at least 30 minutes of cardio activity daily. She will continue to follow Heart Healthy or DASH diet.  10. Follow up She will follow up in 1 month.   No orders of the defined types were placed in this encounter.   Orders Placed This Encounter  Procedures  . MM Digital Diagnostic Bilat  . POCT urine pregnancy    Referral Orders  No referral(s) requested today    Raliegh Ip,  MSN, FNP-BC [facility] [address] [place], [place] [address] [phone] fax  Problem List Items Addressed This Visit    None    Visit Diagnoses    Lump of right breast    -  Primary   Relevant Orders   MM Digital Diagnostic Bilat   Breast tenderness in female       Breast pain, right       Family history of breast cancer in female       Relevant Orders   MM Digital Diagnostic Bilat   Anxiety  Sudden death of family member       Death of family member       Irregular menstrual cycle       Relevant Orders   POCT urine pregnancy (Completed)   Hypertension, unspecified type       Follow up          No orders  of the defined types were placed in this encounter.   Follow-up: Return in about 1 month (around 10/12/2019).    Kallie LocksNatalie M Daran Favaro, FNP

## 2019-09-14 ENCOUNTER — Other Ambulatory Visit (HOSPITAL_COMMUNITY): Payer: Self-pay | Admitting: *Deleted

## 2019-09-14 DIAGNOSIS — N6310 Unspecified lump in the right breast, unspecified quadrant: Secondary | ICD-10-CM

## 2019-09-15 ENCOUNTER — Other Ambulatory Visit: Payer: Self-pay

## 2019-09-15 ENCOUNTER — Encounter (HOSPITAL_COMMUNITY): Payer: Self-pay

## 2019-09-15 ENCOUNTER — Ambulatory Visit (HOSPITAL_COMMUNITY)
Admission: RE | Admit: 2019-09-15 | Discharge: 2019-09-15 | Disposition: A | Discharge status: Home / Self Care | Payer: No Typology Code available for payment source | Source: Ambulatory Visit | Attending: Obstetrics and Gynecology | Admitting: Obstetrics and Gynecology

## 2019-09-15 VITALS — BP 142/92 | Temp 97.3°F | Wt 136.0 lb

## 2019-09-15 DIAGNOSIS — Z1239 Encounter for other screening for malignant neoplasm of breast: Secondary | ICD-10-CM | POA: Insufficient documentation

## 2019-09-15 DIAGNOSIS — N6310 Unspecified lump in the right breast, unspecified quadrant: Secondary | ICD-10-CM

## 2019-09-15 NOTE — Patient Instructions (Addendum)
Explained breast self awareness with [name]. Patient is currently on her menstrual period and unable to complete Pap smear today. Appointment scheduled with [facility] 11/09/2019 at 1000. Referred patient to the [facility] for a diagnostic  Mammogram and right breast ultrasound. Appointment scheduled for Thursday, September 17, 2019 at 0830. Patient aware of appointments and will be there. Told patient to let us know if needs transportation to follow-up appointments. Discussed smoking cessation with patient. Referred to the [facility] Quitline and gave resources to the free smoking cessation classes at [facility]. Diana Mcdowell verbalized understanding.  [name], Arvil Chaco, RN 10:28 AM

## 2019-09-15 NOTE — Progress Notes (Signed)
Complaints of right breast lump and pain x 3 weeks. Patient states the lump has increased in size since she first noticed. Patient states the pain comes and goes. Patient states the right nipple pain is constant. Patient rates the pain at a 4 out of 10.  Pap Smear: Pap smear not completed today. Last Pap smear was 4-5 years ago in prison and normal per patient. Patient is currently on her menstrual period and unable to complete Pap smear today. Appointment scheduled with [facility] 11/09/2019 at 1000.  Per patient has no history of an abnormal Pap smear. No Pap smear results are in Epic.  Physical exam: Breasts Breasts symmetrical. No skin abnormalities bilateral breasts. Bilateral nipple retraction that per patient is normal for her. No nipple discharge bilateral breasts. No lymphadenopathy. No lumps palpated left breast. Palpated a lump within the right breast under the nipple that measures 9 cm x 7 cm. Complaints of tenderness when palpated right breast lump. Referred patient to the [facility] for a diagnostic  Mammogram and right breast ultrasound. Appointment scheduled for Thursday, September 17, 2019 at 0830.        Pelvic/Bimanual No Pap smear completed today since patient is currently on her menstrual period.   Smoking History: Patient is a current smoker. Discussed smoking cessation with patient. Referred to the [facility] Quitline and gave resources to the free smoking cessation classes at [facility].  Patient Navigation: Patient education provided. Access to services provided for patient through [employer] ride provided to patient to get home.  Breast and Cervical Cancer Risk Assessment: Patient has a family history of 3 paternal aunts and her maternal grandmother having breast cancer. Patient has no known genetic mutations or history of radiation treatment to the chest before age 67. Patient has no history of cervical dysplasia, immunocompromised, or DES exposure  in-utero.  Risk Assessment    Risk Scores      09/15/2019   Last edited by: Loletta Parish, RN   5-year risk: 0.6 %   Lifetime risk: 7.3 %

## 2019-09-17 ENCOUNTER — Ambulatory Visit
Admission: RE | Admit: 2019-09-17 | Discharge: 2019-09-17 | Disposition: A | Discharge status: Home / Self Care | Payer: No Typology Code available for payment source | Source: Ambulatory Visit | Attending: Obstetrics and Gynecology | Admitting: Obstetrics and Gynecology

## 2019-09-17 ENCOUNTER — Other Ambulatory Visit (HOSPITAL_COMMUNITY): Payer: Self-pay | Admitting: Obstetrics and Gynecology

## 2019-09-17 ENCOUNTER — Other Ambulatory Visit: Payer: Self-pay

## 2019-09-17 DIAGNOSIS — N6310 Unspecified lump in the right breast, unspecified quadrant: Secondary | ICD-10-CM

## 2019-10-12 ENCOUNTER — Ambulatory Visit: Payer: No Typology Code available for payment source | Admitting: Family Medicine

## 2019-10-12 ENCOUNTER — Other Ambulatory Visit: Payer: Self-pay | Admitting: Family Medicine

## 2019-10-12 DIAGNOSIS — I10 Essential (primary) hypertension: Secondary | ICD-10-CM

## 2019-10-19 ENCOUNTER — Ambulatory Visit: Payer: No Typology Code available for payment source

## 2019-11-09 ENCOUNTER — Ambulatory Visit: Payer: No Typology Code available for payment source

## 2019-11-16 ENCOUNTER — Telehealth (HOSPITAL_COMMUNITY): Payer: Self-pay

## 2019-11-16 NOTE — Telephone Encounter (Signed)
Telephoned patient at home number. Left message to call and schedule with [facility]. °

## 2019-12-14 ENCOUNTER — Emergency Department (HOSPITAL_COMMUNITY): Payer: Medicaid Other

## 2019-12-14 ENCOUNTER — Encounter (HOSPITAL_COMMUNITY): Payer: Self-pay | Admitting: Neurological Surgery

## 2019-12-14 ENCOUNTER — Emergency Department (HOSPITAL_COMMUNITY)
Admission: EM | Admit: 2019-12-14 | Discharge: 2020-01-04 | Disposition: E | Payer: Medicaid Other | Attending: Emergency Medicine | Admitting: Emergency Medicine

## 2019-12-14 DIAGNOSIS — R4189 Other symptoms and signs involving cognitive functions and awareness: Secondary | ICD-10-CM | POA: Diagnosis present

## 2019-12-14 DIAGNOSIS — I615 Nontraumatic intracerebral hemorrhage, intraventricular: Secondary | ICD-10-CM | POA: Diagnosis not present

## 2019-12-14 DIAGNOSIS — F17210 Nicotine dependence, cigarettes, uncomplicated: Secondary | ICD-10-CM | POA: Diagnosis not present

## 2019-12-14 DIAGNOSIS — Z20822 Contact with and (suspected) exposure to covid-19: Secondary | ICD-10-CM | POA: Diagnosis not present

## 2019-12-14 DIAGNOSIS — I10 Essential (primary) hypertension: Secondary | ICD-10-CM | POA: Insufficient documentation

## 2019-12-14 DIAGNOSIS — Z79899 Other long term (current) drug therapy: Secondary | ICD-10-CM | POA: Insufficient documentation

## 2019-12-14 LAB — CBC
HCT: 42 % (ref 36.0–46.0)
Hemoglobin: 13.4 g/dL (ref 12.0–15.0)
MCH: 29.9 pg (ref 26.0–34.0)
MCHC: 31.9 g/dL (ref 30.0–36.0)
MCV: 93.8 fL (ref 80.0–100.0)
Platelets: 463 10*3/uL — ABNORMAL HIGH (ref 150–400)
RBC: 4.48 MIL/uL (ref 3.87–5.11)
RDW: 13.4 % (ref 11.5–15.5)
WBC: 10.8 10*3/uL — ABNORMAL HIGH (ref 4.0–10.5)
nRBC: 0 % (ref 0.0–0.2)

## 2019-12-14 LAB — COMPREHENSIVE METABOLIC PANEL
ALT: 14 U/L (ref 0–44)
AST: 26 U/L (ref 15–41)
Albumin: 4.4 g/dL (ref 3.5–5.0)
Alkaline Phosphatase: 40 U/L (ref 38–126)
Anion gap: 11 (ref 5–15)
BUN: 10 mg/dL (ref 6–20)
CO2: 25 mmol/L (ref 22–32)
Calcium: 9.1 mg/dL (ref 8.9–10.3)
Chloride: 102 mmol/L (ref 98–111)
Creatinine, Ser: 0.95 mg/dL (ref 0.44–1.00)
GFR calc Af Amer: >60 mL/min (ref >60)
GFR calc non Af Amer: >60 mL/min (ref >60)
Glucose, Bld: 159 mg/dL — ABNORMAL HIGH (ref 70–99)
Potassium: 3.4 mmol/L — ABNORMAL LOW (ref 3.5–5.1)
Sodium: 138 mmol/L (ref 135–145)
Total Bilirubin: 0.4 mg/dL (ref 0.3–1.2)
Total Protein: 8 g/dL (ref 6.5–8.1)

## 2019-12-14 LAB — I-STAT CHEM 8, ED
BUN: 9 mg/dL (ref 6–20)
Calcium, Ion: 1.12 mmol/L — ABNORMAL LOW (ref 1.15–1.40)
Chloride: 103 mmol/L (ref 98–111)
Creatinine, Ser: 0.8 mg/dL (ref 0.44–1.00)
Glucose, Bld: 156 mg/dL — ABNORMAL HIGH (ref 70–99)
HCT: 44 % (ref 36.0–46.0)
Hemoglobin: 15 g/dL (ref 12.0–15.0)
Potassium: 3.2 mmol/L — ABNORMAL LOW (ref 3.5–5.1)
Sodium: 140 mmol/L (ref 135–145)
TCO2: 27 mmol/L (ref 22–32)

## 2019-12-14 LAB — DIFFERENTIAL
Abs Immature Granulocytes: 0.05 10*3/uL (ref 0.00–0.07)
Basophils Absolute: 0.1 10*3/uL (ref 0.0–0.1)
Basophils Relative: 1 %
Eosinophils Absolute: 0.1 10*3/uL (ref 0.0–0.5)
Eosinophils Relative: 1 %
Immature Granulocytes: 1 %
Lymphocytes Relative: 28 %
Lymphs Abs: 3 10*3/uL (ref 0.7–4.0)
Monocytes Absolute: 0.8 10*3/uL (ref 0.1–1.0)
Monocytes Relative: 8 %
Neutro Abs: 6.8 10*3/uL (ref 1.7–7.7)
Neutrophils Relative %: 61 %

## 2019-12-14 LAB — RESPIRATORY PANEL BY RT PCR (FLU A&B, COVID)
Influenza A by PCR: NEGATIVE
Influenza B by PCR: NEGATIVE
SARS Coronavirus 2 by RT PCR: NEGATIVE

## 2019-12-14 LAB — I-STAT BETA HCG BLOOD, ED (MC, WL, AP ONLY): I-stat hCG, quantitative: <5 m[IU]/mL (ref <5)

## 2019-12-14 LAB — PROTIME-INR
INR: 1 (ref 0.8–1.2)
Prothrombin Time: 13.1 seconds (ref 11.4–15.2)

## 2019-12-14 LAB — CBG MONITORING, ED: Glucose-Capillary: 150 mg/dL — ABNORMAL HIGH (ref 70–99)

## 2019-12-14 LAB — APTT: aPTT: 32 seconds (ref 24–36)

## 2019-12-14 MED ORDER — HALOPERIDOL LACTATE 5 MG/ML IJ SOLN: 0.5000 mg | INTRAMUSCULAR | Status: DC | PRN

## 2019-12-14 MED ORDER — HYDRALAZINE HCL 20 MG/ML IJ SOLN
INTRAMUSCULAR | Status: AC
  Filled 2019-12-14: qty 1

## 2019-12-14 MED ORDER — ETOMIDATE 2 MG/ML IV SOLN
INTRAVENOUS | Status: AC | PRN
  Administered 2019-12-14: 20 mg via INTRAVENOUS

## 2019-12-14 MED ORDER — CLEVIDIPINE BUTYRATE 0.5 MG/ML IV EMUL
0.0000 mg/h | INTRAVENOUS | Status: DC
  Administered 2019-12-14: 2 mg/h via INTRAVENOUS
  Filled 2019-12-14: qty 50

## 2019-12-14 MED ORDER — MORPHINE SULFATE (PF) 4 MG/ML IV SOLN
6.0000 mg | INTRAVENOUS | Status: DC | PRN
  Administered 2019-12-14: 6 mg via INTRAVENOUS
  Filled 2019-12-14: qty 2

## 2019-12-14 MED ORDER — PROPOFOL 10 MG/ML IV BOLUS
INTRAVENOUS | Status: AC | PRN
  Administered 2019-12-14: 30 ug via INTRAVENOUS
  Administered 2019-12-14: 30 mg via INTRAVENOUS

## 2019-12-14 MED ORDER — SODIUM CHLORIDE 0.9% FLUSH: 3.0000 mL | Freq: Once | INTRAVENOUS | Status: DC

## 2019-12-14 MED ORDER — MORPHINE BOLUS VIA INFUSION
4.0000 mg | INTRAVENOUS | Status: DC | PRN
  Filled 2019-12-14: qty 4

## 2019-12-14 MED ORDER — HALOPERIDOL 1 MG PO TABS: 0.5000 mg | ORAL_TABLET | ORAL | Status: DC | PRN

## 2019-12-14 MED ORDER — SUCCINYLCHOLINE CHLORIDE 20 MG/ML IJ SOLN
INTRAMUSCULAR | Status: AC | PRN
  Administered 2019-12-14: 100 mg via INTRAVENOUS

## 2019-12-14 MED ORDER — ATROPINE SULFATE 1 MG/ML IJ SOLN
INTRAMUSCULAR | Status: AC | PRN
  Administered 2019-12-14: 0.5 mg via INTRAVENOUS

## 2019-12-14 MED ORDER — HALOPERIDOL LACTATE 2 MG/ML PO CONC
0.5000 mg | ORAL | Status: DC | PRN
  Filled 2019-12-14: qty 0.3

## 2019-12-14 MED ORDER — MORPHINE 100MG IN NS 100ML (1MG/ML) PREMIX INFUSION: 5.0000 mg/h | INTRAVENOUS | Status: DC

## 2019-12-14 MED ORDER — LORAZEPAM 2 MG/ML IJ SOLN: 2.0000 mg | INTRAMUSCULAR | Status: DC | PRN

## 2019-12-14 MED ORDER — LEVETIRACETAM IN NACL 1500 MG/100ML IV SOLN
1500.0000 mg | Freq: Once | INTRAVENOUS | Status: DC
  Filled 2019-12-14: qty 100

## 2019-12-14 MED ORDER — MANNITOL 25 % IV SOLN
100.0000 g | Freq: Once | INTRAVENOUS | Status: DC
  Filled 2019-12-14: qty 400

## 2019-12-14 MED ORDER — GLYCOPYRROLATE 0.2 MG/ML IJ SOLN: 0.1000 mg | INTRAMUSCULAR | Status: DC

## 2020-01-04 NOTE — ED Notes (Signed)
Time of death 2034.  Family and RN bedside at time.  Provider called to call time of death.

## 2020-01-04 NOTE — Code Documentation (Signed)
MD Lindzen made aware BP is not responding to medication. Asked to consult EDP for guidance. EDP Long consulted and stated that the current pressure management was appropriate. Pharmacy approved titrating Cleviprex to 32 mg/hr.

## 2020-01-04 NOTE — Code Documentation (Signed)
Neurosurgery at the bedside. Evaluated patient. Reported they would hand off to MD Long.

## 2020-01-04 NOTE — Code Documentation (Signed)
MD Lindzen made aware of pt's BP with Cleviprex maxed out. Verbal order to give Hydralazine 10 mg once Cleviprex was maxed.

## 2020-01-04 NOTE — Consult Note (Signed)
NEURO HOSPITALIST CONSULT NOTE   Requesting physician: Dr. Laverta Baltimore  Reason for Consult: Acute onset of obtundation and enlarging, unreactive left pupil. Had sudden onset of LOC at work.  History obtained from:  EMS and Chart    HPI:                                                                                                                                          Diana Mcdowell is an 44 y.o. female who presents to the ED as a Code Stroke after sudden onset of LOC at work after which she was unarousable. LOC was preceded by patient stating that she was not feeling well. LSN 1530. She continued to be unresponsive upon EMS arrival, except to pain. She started vomiting upon arrival to the ED. Decision was made to emergently intubate for airway protection.   Her PMHx includes chronic migraine headaches and iron deficiency anemia secondary to metromenorrhagia and GI bleeding from NSAIDs. She smokes tobacco. Marijuana use is also noted in her social history.   Past Medical History:  Diagnosis Date  . Anemia   . Chronic headaches 05/13/2017  . 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 Iron deficiency anemia due to chronic blood loss - metromenorrhagia and GI bleeding from NSAID's   . Iron deficiency anemia due to chronic blood loss - metromenorrhagia and GI bleeding from NSAID's   . Menorrhagia with irregular cycle 05/13/2017  . Migraines     Past Surgical History:  Procedure Laterality Date  . ESOPHAGOGASTRODUODENOSCOPY N/A 05/13/2017   Procedure: ESOPHAGOGASTRODUODENOSCOPY (EGD);  Surgeon: Gatha Mayer, MD;  Location: [facility] ENDOSCOPY;  Service: Endoscopy;  Laterality: N/A;  . TUBAL LIGATION  2002    Family History  Problem Relation Age of Onset  . Hypertension Mother   . Diabetes Mother   . Cancer Maternal Grandfather   . Diabetes Paternal Grandmother   . Breast cancer Paternal Grandmother   . Breast cancer Paternal Aunt   . Breast cancer Paternal Aunt    . Breast cancer Paternal Aunt               Social History:  reports that she has been smoking cigarettes. She has been smoking about 0.50 packs per day. She has never used smokeless tobacco. She reports current drug use. Drug: Marijuana. She reports that she does not drink alcohol.  No Known Allergies  HOME MEDICATIONS:  No current facility-administered medications on file prior to encounter.   Current Outpatient Medications on File Prior to Encounter  Medication Sig Dispense Refill  . escitalopram (LEXAPRO) 10 MG tablet Take 1 tablet (10 mg total) by mouth daily. (Patient not taking: Reported on 06/04/2018) 30 tablet 0  . ferrous sulfate 325 (65 FE) MG tablet Take 1 tablet (325 mg total) by mouth 2 (two) times daily with a meal. (Patient not taking: Reported on 09/25/2017) 60 tablet 0  . gabapentin (NEURONTIN) 100 MG capsule Take 1-2 capsules (100-200 mg total) by mouth 2 (two) times daily as needed. (Patient not taking: Reported on 06/04/2018) 90 capsule 3  . hydrOXYzine (ATARAX/VISTARIL) 50 MG tablet Take 2 tablets (100 mg total) by mouth at bedtime as needed and may repeat dose one time if needed for itching. (Patient not taking: Reported on 09/25/2017) 60 tablet 1  . megestrol (MEGACE) 40 MG tablet Take 1 tablet (40 mg total) by mouth 2 (two) times daily. 60 tablet 1  . pantoprazole (PROTONIX) 40 MG tablet Take 1 tablet (40 mg total) by mouth daily before breakfast. 30 tablet 1  . traZODone (DESYREL) 50 MG tablet Take 1 tablet (50 mg total) by mouth at bedtime. 30 tablet 1     ROS:                                                                                                                                       Unable to obtain due to obtundation.    There were no vitals taken for this visit.   General Examination:                                                                                                        Physical Exam  HEENT-  [place]/AT    Abdomen- Nondistended Extremities- Warm and well perfused with no edema noted.   Neurological Examination Mental Status: Eyes closed with no eye opening or purposeful movement to external stimuli. Not responding to name. Not following any commands. Nonverbal. No attempts to communicate.  Cranial Nerves: II: No blink to threat. Left pupil 6 mm and unreactive. Right pupil 3 mm and sluggishly reactive.   III,IV, VI: Eyes conjugately at the midline with no movement to stimulation.  V,VII: No blink to light eyelid stimulation bilaterally. Face flaccidly symmetric.  VIII: No response to voice IX,X: Cough reflex intact XI: Head is midline XII: Unable to assess Motor: Flaccid tone x 4.  LUE and  LLE with extensor posturing to noxious stimuli. Intermittent slight flexion at left ankle and knee also noted.  RUE flaccid with no movement to noxious stimuli RLE with minimal ankle dorsiflexion to noxious stimuli Sensory: No response to right arm pinch. Responses as above to left arm pinch and noxious plantar stimulation bilaterally  Deep Tendon Reflexes: Trace right brachioradialis. 1+ left brachioradialis. 4+ right patellar (crossed adductor response on the left). 3+ left patellar. Toes mute bilaterally  Cerebellar/Gait: Unable to assess.     Lab Results: Basic Metabolic Panel: No results for input(s): NA, K, CL, CO2, GLUCOSE, BUN, CREATININE, CALCIUM, MG, PHOS in the last 168 hours.  CBC: Recent Labs  Lab 01-02-20 1615  WBC 10.8*  NEUTROABS 6.8  HGB 13.4  HCT 42.0  MCV 93.8  PLT 463*    Cardiac Enzymes: No results for input(s): CKTOTAL, CKMB, CKMBINDEX, TROPONINI in the last 168 hours.  Lipid Panel: No results for input(s): CHOL, TRIG, HDL, CHOLHDL, VLDL, LDLCALC in the last 168 hours.  Imaging: No results found.   Assessment: 44 year old female presenting with acute onset of obtundation and  enlarging, unreactive left pupil. Had sudden onset of LOC at work. Was vomiting soon after arrival to the ED and continues to be unresponsive.   1. Currently being emergently intubated for airway protection.  2. 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   Recommendations: 1. STAT CT head after intubation.  2. May need CTA of head and neck pending CT head results.  3. May need STAT Neurosurgery consult  Addendum: CT head reveals a large volume acute hemorrhage within the lateral, third and fourth ventricles. Hemorrhage somewhat asymmetrically extends the left thalamocapsular region, and it is suspected that this started as a left thalamocapsular parenchymal hemorrhage. 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 Trace scattered subarachnoid hemorrhage is also suspected along the bilateral cerebral hemispheres. There is diffuse cerebral sulcal effacement.  -- STAT Neurosurgery consult for possible intraventricular drain has been placed by Dr. Jacqulyn Bath.  -- BP management with clevidipine. This medication is nearly maxed out with patient remaining hypertensive.  -- 10 mg hydralazine has also not significantly reduced her BP.  -- Labetalol was initially not an option due to bradycardia, but now may be an option after resolution of bradycardia with resultant tachycardia after administration of atropine. Will defer to Dr. Jacqulyn Bath regarding complex BP management due to fluctuating heart rate and recent administration of atropine.  -- Recommended SBP goal of < 140.   60 minutes spent in the emergent neurological evaluation and management of this critically ill patient.   Electronically signed: Dr. Caryl Pina 02-Jan-2020, 4:32 PM

## 2020-01-04 NOTE — Procedures (Signed)
Extubation Procedure Note  Patient Details:   Name: Diana Mcdowell DOB: Nov 02, 1976 MRN: [id number]   Airway Documentation:    Vent end date: 01-10-20 Vent end time: 2006   Evaluation  O2 sats: monitor off due to comfort measures Complications: No apparent complications Patient did tolerate procedure well.     No   ED MD removed ETT. RT was at bedside during removal.   Christi Wirick A Smith [place] Jan 10, 2020, 8:07 PM

## 2020-01-04 NOTE — ED Notes (Signed)
NG tube placed.

## 2020-01-04 NOTE — ED Notes (Signed)
Pt intubated with color change. Bilateral breath sounds heard

## 2020-01-04 NOTE — ED Notes (Signed)
Multiple Family Members in Consult RM A waiting to speak with Dr.

## 2020-01-04 NOTE — ED Notes (Signed)
Pt was from work when she was not feeling well. LSN 1530. Pt became unresponsive and EMS was called.Pt responsive to pain only. Pt started vomiting upon arrival. Pt moved to room for intubation

## 2020-01-04 NOTE — Code Documentation (Signed)
MD Lindzen aware of pt's BP. MD to come and talk with family about next steps.

## 2020-01-04 NOTE — ED Provider Notes (Signed)
[facility] EMERGENCY DEPARTMENT Provider Note   CSN: [id number] Arrival date & time: 2020-01-12  1612  An emergency department physician performed an initial assessment on this suspected stroke patient at 31.  History No chief complaint on file.   Diana Mcdowell is a 44 y.o. female.  The history is provided by the EMS personnel. The history is limited by the condition of the patient.   44yo F with hx of breast cancer, presenting to the ED for unresponsiveness. ROS limited secondary to mental status and acuity. Per report patient was at work, when she became sleepy, sat down, and became unresponsive with 1 episode of emesis. No blood thinners. No seizure activity. Patient was responsive to painful stimuli but otherwise not moving her arms or legs spontaneously. No hx of trauma. No hx of HTN or stroke, cardiac disease. On arrival patient was GCS 8, with left upper ext localizing but RUE flaccid. Pupils asymmetric as well. Patient was made a code stroke on arrival.     Past Medical History:  Diagnosis Date  . Anemia   . Chronic headaches 05/13/2017  . 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 Iron deficiency anemia due to chronic blood loss - metromenorrhagia and GI bleeding from NSAID's   . Iron deficiency anemia due to chronic blood loss - metromenorrhagia and GI bleeding from NSAID's   . Menorrhagia with irregular cycle 05/13/2017  . Migraines     Patient Active Problem List   Diagnosis Date Noted  . Screening breast examination 09/15/2019  . Hypertension 10-Oct-2019  . Sudden death of family member 10/10/19  . Anxiety 10-10-19  . Family history of breast cancer in female October 10, 2019  . Breast pain, right 10/10/19  . Breast tenderness in female 10/10/19  . Lump of right breast October 10, 2019  . Menorrhagia with irregular cycle 05/13/2017  . 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 Chronic headaches 05/13/2017  . Melena   . Iron  deficiency anemia due to chronic blood loss - metromenorrhagia and GI bleeding from NSAID's   . Acute blood loss anemia 05/12/2017  . Insomnia 05/12/2017  . GI bleed 05/11/2017    Past Surgical History:  Procedure Laterality Date  . ESOPHAGOGASTRODUODENOSCOPY N/A 05/13/2017   Procedure: ESOPHAGOGASTRODUODENOSCOPY (EGD);  Surgeon: Gatha Mayer, MD;  Location: [facility] ENDOSCOPY;  Service: Endoscopy;  Laterality: N/A;  . TUBAL LIGATION  2002     OB History    Gravida  6   Para  5   Term  4   Preterm  1   AB  1   Living        SAB  1   TAB      Ectopic      Multiple      Live Births              Family History  Problem Relation Age of Onset  . Hypertension Mother   . Diabetes Mother   . Cancer Maternal Grandfather   . Diabetes Paternal Grandmother   . Breast cancer Paternal Grandmother   . Breast cancer Paternal Aunt   . Breast cancer Paternal Aunt   . Breast cancer Paternal Aunt     Social History   Tobacco Use  . Smoking status: Current Every Day Smoker    Packs/day: 0.50    Types: Cigarettes  . Smokeless tobacco: Never Used  Substance Use Topics  . Alcohol use: No  . Drug use: Yes    Types:  Marijuana    Home Medications Prior to Admission medications   Medication Sig Start Date End Date Taking? Authorizing Provider  escitalopram (LEXAPRO) 10 MG tablet Take 1 tablet (10 mg total) by mouth daily. Patient not taking: Reported on 06/04/2018 09/25/17   Bing Neighbors, FNP  ferrous sulfate 325 (65 FE) MG tablet Take 1 tablet (325 mg total) by mouth 2 (two) times daily with a meal. Patient not taking: Reported on 09/25/2017 05/13/17   Elgergawy, Leana Roe, MD  gabapentin (NEURONTIN) 100 MG capsule Take 1-2 capsules (100-200 mg total) by mouth 2 (two) times daily as needed. Patient not taking: Reported on 06/04/2018 09/25/17   Bing Neighbors, FNP  hydrOXYzine (ATARAX/VISTARIL) 50 MG tablet Take 2 tablets (100 mg total) by mouth at bedtime as needed  and may repeat dose one time if needed for itching. Patient not taking: Reported on 09/25/2017 05/21/17   Bing Neighbors, FNP  megestrol (MEGACE) 40 MG tablet Take 1 tablet (40 mg total) by mouth 2 (two) times daily. 06/05/18   Kallie Locks, FNP  pantoprazole (PROTONIX) 40 MG tablet Take 1 tablet (40 mg total) by mouth daily before breakfast. 06/05/18   Kallie Locks, FNP  traZODone (DESYREL) 50 MG tablet Take 1 tablet (50 mg total) by mouth at bedtime. 06/05/18   Kallie Locks, FNP    Allergies    Patient has no known allergies.  Review of Systems   Review of Systems  Unable to perform ROS: Patient unresponsive    Physical Exam Updated Vital Signs BP (!) 61/53   Pulse (!) 118   Temp (!) 93.7 F (34.3 C)   Resp 16   Ht 5\' 8"  (1.727 m)   SpO2 100%   BMI 20.68 kg/m   Physical Exam Vitals and nursing note reviewed.  Constitutional:      General: She is not in acute distress.    Appearance: She is well-developed. She is ill-appearing. She is not toxic-appearing or diaphoretic.     Comments: GCS 8 Responsive to painful stimuli only RUE flexion LUE localizing Not moving legs Moaning Episode of emesis Pupils assymetric  HENT:     Head: Normocephalic and atraumatic.     Right Ear: External ear normal.     Left Ear: External ear normal.     Nose: Nose normal.     Mouth/Throat:     Mouth: Mucous membranes are moist.     Pharynx: Oropharynx is clear.  Eyes:     Conjunctiva/sclera: Conjunctivae normal.     Comments: Left pupil 37mm NR Right 17mm NR  Cardiovascular:     Rate and Rhythm: Regular rhythm. Bradycardia present.     Heart sounds: No murmur.  Pulmonary:     Effort: Pulmonary effort is normal. No respiratory distress.     Breath sounds: Normal breath sounds.  Abdominal:     Palpations: Abdomen is soft.     Tenderness: There is no abdominal tenderness. There is no guarding or rebound.     Hernia: No hernia is present.  Musculoskeletal:     Cervical  back: Neck supple.  Skin:    General: Skin is warm and dry.     Capillary Refill: Capillary refill takes 2 to 3 seconds.     Findings: No lesion or rash.  Neurological:     Mental Status: She is disoriented.     Motor: Weakness present.     ED Results / Procedures / Treatments   Labs (  all labs ordered are listed, but only abnormal results are displayed) Labs Reviewed  CBC - Abnormal; Notable for the following components:      Result Value   WBC 10.8 (*)    Platelets 463 (*)    All other components within normal limits  COMPREHENSIVE METABOLIC PANEL - Abnormal; Notable for the following components:   Potassium 3.4 (*)    Glucose, Bld 159 (*)    All other components within normal limits  I-STAT CHEM 8, ED - Abnormal; Notable for the following components:   Potassium 3.2 (*)    Glucose, Bld 156 (*)    Calcium, Ion 1.12 (*)    All other components within normal limits  CBG MONITORING, ED - Abnormal; Notable for the following components:   Glucose-Capillary 150 (*)    All other components within normal limits  RESPIRATORY PANEL BY RT PCR (FLU A&B, COVID)  PROTIME-INR  APTT  DIFFERENTIAL  I-STAT BETA HCG BLOOD, ED ([facility], [facility], [facility] ONLY)    EKG None  Radiology DG Chest Portable 1 View  Result Date: 01-Jan-2020 CLINICAL DATA:  Post intubation EXAM: PORTABLE CHEST 1 VIEW COMPARISON:  05/11/2017 FINDINGS: Endotracheal to is 2 cm above the carina. NG tube tip is in the stomach. Heart is borderline in size. No confluent opacities or effusions. No acute bony abnormality. IMPRESSION: Support devices in expected position as above. No acute cardiopulmonary disease. Electronically Signed   By: Charlett Nose M.D.   On: 01-Jan-2020 17:42   CT HEAD CODE STROKE WO CONTRAST  Result Date: 01/01/2020 CLINICAL DATA:  Neuro deficit, acute, stroke suspected. EXAM: CT HEAD WITHOUT CONTRAST TECHNIQUE: Contiguous axial images were obtained from the base of the skull through the vertex without intravenous  contrast. COMPARISON:  Head CT 03/21/2010 FINDINGS: Brain: There is large volume acute hemorrhage within the lateral, third and fourth ventricles. Hemorrhage somewhat asymmetrically extends to the left thalamocapsular region, and it is suspected that this started as a left thalamocapsular parenchymal hemorrhage. Associated tetraventricular hydrocephalus. Ill-defined hypoattenuation along the margins of the lateral ventricles posteriorly may reflect transependymal flow of CSF. Diffuse effacement of cerebral sulci. Trace scattered subarachnoid hemorrhage is suspected along the bilateral cerebral hemispheres. No midline shift. Vascular: No hyperdense vessel is definitively identified. Skull: Normal. Negative for fracture or focal lesion. Sinuses/Orbits: Visualized orbits demonstrate no acute abnormality. No significant paranasal sinus disease or mastoid effusion at the imaged levels. These results were called by telephone at the time of interpretation on January 01, 2020 at 5:04 pm to provider Dr. Otelia Limes, Who verbally acknowledged these results. IMPRESSION: Large volume acute hemorrhage within the lateral, third and fourth ventricles. Hemorrhage somewhat asymmetrically extends the left thalamocapsular region, and it is suspected that this started as a left thalamocapsular parenchymal hemorrhage. Associated tetraventricular hydrocephalus with possible transependymal flow of CSF. Trace scattered subarachnoid hemorrhage is also suspected along the bilateral cerebral hemispheres. Diffuse cerebral sulcal effacement. Electronically Signed   By: Jackey Loge DO   On: 01-01-20 17:05    Procedures Procedure Name: Intubation Date/Time: January 01, 2020 4:55 PM Performed by: Jamey Reas, MD Pre-anesthesia Checklist: Patient identified, Emergency Drugs available, Suction available, Patient being monitored and Timeout performed Oxygen Delivery Method: Non-rebreather mask Preoxygenation: Pre-oxygenation with 100% oxygen Induction  Type: Rapid sequence Ventilation: Nasal airway inserted- appropriate to patient size Laryngoscope Size: Glidescope and 3 Grade View: Grade I Tube size: 8.0 mm Number of attempts: 1 Airway Equipment and Method: Video-laryngoscopy and Rigid stylet Placement Confirmation: ETT inserted through vocal cords under direct  vision,  Positive ETCO2 and Breath sounds checked- equal and bilateral Secured at: 23 cm Tube secured with: ETT holder      (including critical care time)  Medications Ordered in ED Medications  sodium chloride flush (NS) 0.9 % injection 3 mL (3 mLs Intravenous Not Given 2020-01-02 1707)  clevidipine (CLEVIPREX) infusion 0.5 mg/mL (0 mg/hr Intravenous Stopped 01/02/20 1825)  mannitol 25 % injection 100 g (0 g Intravenous Hold 01-02-2020 1735)  levETIRAcetam (KEPPRA) IVPB 1500 mg/ 100 mL premix (has no administration in time range)  haloperidol (HALDOL) tablet 0.5 mg (has no administration in time range)    Or  haloperidol (HALDOL) 2 MG/ML solution 0.5 mg (has no administration in time range)    Or  haloperidol lactate (HALDOL) injection 0.5 mg (has no administration in time range)  morphine bolus via infusion 4 mg (has no administration in time range)  morphine 100mg  in NS (1mg /mL) infusion - premix (has no administration in time range)  LORazepam (ATIVAN) injection 2 mg (has no administration in time range)  morphine 4 MG/ML injection 6 mg (6 mg Intravenous Given January 02, 2020 2004)  glycopyrrolate (ROBINUL) injection 0.1 mg (has no administration in time range)  etomidate (AMIDATE) injection (20 mg Intravenous Given 01/02/2020 1634)  atropine injection (0.5 mg Intravenous Given 02-Jan-2020 1633)  succinylcholine (ANECTINE) injection (100 mg Intravenous Given 01-02-2020 1634)  propofol (DIPRIVAN) 10 mg/mL bolus/IV push (30 mcg Intravenous Given by Other 01-02-20 1655)  hydrALAZINE (APRESOLINE) 20 MG/ML injection (  Given Jan 02, 2020 1709)    ED Course  I have reviewed the triage vital  signs and the nursing notes.  Pertinent labs & imaging results that were available during my care of the patient were reviewed by me and considered in my medical decision making (see chart for details).    MDM Rules/Calculators/A&P                      43yo F presenting for unresponsiveness. Concern for stroke. Also concern for tox/metabolic, intoxication, sepsis or possible intracranial bleed. On arrival patient was bradycardic, HTN, concern for ICP elevation. Initially patient protecting airway but then started to vomit and became more comatose and obtunded. Intubated for airway. See above note. Tolerated well, given atropine pre-intubation for bradycardia. NSU consulted emergently as CT head with large IVH and parenchymal bleed was present. Cleviprex started for bp control, keppra and mannitol also given. Family updated as well. Unsure etiology, could be mets from cancer, AV malformation, aneurysm as well, possible drug intox with HTN bleed.   Neurosurgery evaluated the patient, no intervention indicated at this time, nonsurvivable event.  Family was updated, extensive conversations were had with family throughout her ED stay regarding survivability as well as prognosis of this intracranial hemorrhage.  Decision was made to make patient DNR.  Comfort care as well.  Patient will be extubated in the emergency department, admitted to hospitalist for comfort care and palliative care.  Patient became acutely hypotensive as well as hypothermic in the emergency department, Cleviprex drip as well as propofol drip were discontinued.  Likely sequelae of nonsurvivable head bleed.  Patient was compassionately extubated in the emergency department, eventually expired.  Time of death 8:34 PM.  The certificate was filled out, not an ME case.  Family was bedside as well.  Patient likely passed from herniation secondary to her intraventricular hemorrhage.  My attending physician was present during the care and  decision making for this patient.  Final Clinical Impression(s) / ED Diagnoses Final diagnoses:  Intraventricular hemorrhage (HCC)  Unresponsive episode    Rx / DC Orders ED Discharge Orders    None       Jamey ReasHunter, Zoila Ditullio J, MD 01-01-20 2056    Maia PlanLong, Joshua G, MD 01-01-20 2259

## 2020-01-04 NOTE — Progress Notes (Signed)
Mother of this patient asked me to pray with this patient (her daughter) also in the Emergency Dept.   This patient was not conscious but this brought comfort for the mother.    Several family members began to show up to see this patient and especially as they learned of her condition.. There were many things to deal with.  First getting parents of this patient together to talk about what had happened and after that, the staff and chaplain  were working to bring family members back to the room  2 or 3 at a time.   There were so many people here that it was very difficult to manage.  The Father of this patient asked security to help because some of the extended family would go down the hall when the doors opened even though they were told to please wait.   More and more family came and the visiting was out of control.  Security and chaplain and nursing staff worked to get people to go home and let the close family be together. Phebe Colla, Chaplain    2019-12-25 2000  Clinical Encounter Type  Visited With Patient;Family;Health care provider  Visit Type Initial;Spiritual support  Referral From Family  Consult/Referral To Chaplain  Spiritual Encounters  Spiritual Needs Prayer  Stress Factors  Patient Stress Factors Health changes;Other (Comment)  Family Stress Factors Loss;Other (Comment)

## 2020-01-04 NOTE — ED Notes (Signed)
[phone]  Mother name is Dorthula also and they are waiting in the car for someone to contact them

## 2020-01-04 NOTE — ED Notes (Signed)
Provider removing pt from breathing tube.  Family bedside.

## 2020-01-04 NOTE — Consult Note (Signed)
Reason for Consult: IVH Referring Physician: EDP  Diana Mcdowell is an 44 y.o. female.   HPI:  Unfortunate 44 year old female who is intubated and comatose and therefore unable to give history or cooperate with physical.  She was apparently at work today and around 330 sat down not feeling well.  She then collapsed to the floor unresponsive.  She was anisocoric in route.  She was intubated upon arrival to the emergency department where she was posturing.  She was hypertensive in the 220s.  She has been given dipper Zenaida Niece and Cleviprex.  She was given succinylcholine and etomidate for intubation over an hour ago.  CT scan showed a left basal ganglia hemorrhage with intraventricular extension and neurosurgical evaluation was requested.  Past Medical History:  Diagnosis Date  . Anemia   . Chronic headaches 05/13/2017  . 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 Iron deficiency anemia due to chronic blood loss - metromenorrhagia and GI bleeding from NSAID's   . Iron deficiency anemia due to chronic blood loss - metromenorrhagia and GI bleeding from NSAID's   . Menorrhagia with irregular cycle 05/13/2017  . Migraines     Past Surgical History:  Procedure Laterality Date  . ESOPHAGOGASTRODUODENOSCOPY N/A 05/13/2017   Procedure: ESOPHAGOGASTRODUODENOSCOPY (EGD);  Surgeon: Iva Boop, MD;  Location: [facility] ENDOSCOPY;  Service: Endoscopy;  Laterality: N/A;  . TUBAL LIGATION  2002    No Known Allergies  Social History   Tobacco Use  . Smoking status: Current Every Day Smoker    Packs/day: 0.50    Types: Cigarettes  . Smokeless tobacco: Never Used  Substance Use Topics  . Alcohol use: No    Family History  Problem Relation Age of Onset  . Hypertension Mother   . Diabetes Mother   . Cancer Maternal Grandfather   . Diabetes Paternal Grandmother   . Breast cancer Paternal Grandmother   . Breast cancer Paternal Aunt   . Breast cancer Paternal Aunt   . Breast cancer Paternal Aunt       Review of Systems  Positive ROS: Unable to obtain  All other systems have been reviewed and were otherwise negative with the exception of those mentioned in the HPI and as above.  Objective: Vital signs in last 24 hours: Pulse Rate:  [35-149] 149 (01/11 1702) Resp:  [18-26] 18 (01/11 1702) BP: (186-231)/(88-154) 198/130 (01/11 1709) SpO2:  [100 %] 100 % (01/11 1702) FiO2 (%):  [100 %] 100 % (01/11 1650)  General Appearance: Young African-American female intubated in a gurney Head: Normocephalic, without obvious abnormality, atraumatic Eyes: Pupils fixed and dilated at 7 mm on the left and 5 mm and and regular on the right      Ears: Normal TM's and external ear canals, both ears Throat: Intubated Neck: Supple Back: Symmetric Lungs: respirations unlabored Heart: Tachycardic rhythm Abdomen: Soft Extremities: Extremities normal, atraumatic, no cyanosis or edema Pulses: 2+ and symmetric all extremities Skin: Skin color, texture, turgor normal, no rashes or lesions  NEUROLOGIC:   Mental status: GCS 3 Motor Exam -no response to deep painful stimuli, there was no exam available Sensory Exam -unable to obtain Reflexes: areflexic Coordination -unable to assess Gait -unable to assess Balance -unable to assess Cranial Nerves: I: smell Not tested  II: visual acuity  OS: na    OD: na  II: visual fields uanble to test  II: pupils  fixed and dilated  III,VII: ptosis   III,IV,VI: extraocular muscles    V: mastication  V: facial light touch sensation    V,VII: corneal reflex   absent  VII: facial muscle function - upper    VII: facial muscle function - lower   VIII: hearing   IX: soft palate elevation    IX,X: gag reflex  absent  XI: trapezius strength    XI: sternocleidomastoid strength   XI: neck flexion strength    XII: tongue strength      Data Review Lab Results  Component Value Date   WBC 10.8 (H) 27-Dec-2019   HGB 15.0 27-Dec-2019   HCT 44.0 2019-12-27    MCV 93.8 12/27/2019   PLT 463 (H) Dec 27, 2019   Lab Results  Component Value Date   NA 140 12/27/19   K 3.2 (L) 2019-12-27   CL 103 2019-12-27   CO2 25 12/27/19   BUN 9 12/27/19   CREATININE 0.80 December 27, 2019   GLUCOSE 156 (H) Dec 27, 2019   Lab Results  Component Value Date   INR 1.0 Dec 27, 2019    Radiology: CT HEAD CODE STROKE WO CONTRAST  Result Date: 2019-12-27 CLINICAL DATA:  Neuro deficit, acute, stroke suspected. EXAM: CT HEAD WITHOUT CONTRAST TECHNIQUE: Contiguous axial images were obtained from the base of the skull through the vertex without intravenous contrast. COMPARISON:  Head CT 03/21/2010 FINDINGS: Brain: There is large volume acute hemorrhage within the lateral, third and fourth ventricles. Hemorrhage somewhat asymmetrically extends to the left thalamocapsular region, and it is suspected that this started as a left thalamocapsular parenchymal hemorrhage. Associated tetraventricular hydrocephalus. Ill-defined hypoattenuation along the margins of the lateral ventricles posteriorly may reflect transependymal flow of CSF. Diffuse effacement of cerebral sulci. Trace scattered subarachnoid hemorrhage is suspected along the bilateral cerebral hemispheres. No midline shift. Vascular: No hyperdense vessel is definitively identified. Skull: Normal. Negative for fracture or focal lesion. Sinuses/Orbits: Visualized orbits demonstrate no acute abnormality. No significant paranasal sinus disease or mastoid effusion at the imaged levels. These results were called by telephone at the time of interpretation on December 27, 2019 at 5:04 pm to provider Dr. Cheral Marker, Who verbally acknowledged these results. IMPRESSION: Large volume acute hemorrhage within the lateral, third and fourth ventricles. Hemorrhage somewhat asymmetrically extends the left thalamocapsular region, and it is suspected that this started as a left thalamocapsular parenchymal hemorrhage. Associated tetraventricular hydrocephalus with  possible transependymal flow of CSF. Trace scattered subarachnoid hemorrhage is also suspected along the bilateral cerebral hemispheres. Diffuse cerebral sulcal effacement. Electronically Signed   By: Kellie Simmering DO   On: 2019-12-27 17:05     Assessment/Plan: Estimated body mass index is 20.68 kg/m as calculated from the following:   Height as of this encounter: 5\' 8"  (1.727 m).   Weight as of 09/15/19: 61.7 kg.   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  She is already fixed and dilated and no response to painful stimuli and no gag and no corneals.  Prognosis for functional recovery is grim.  Her ventricles are casted and therefore there is no where to put a ventriculostomy drain to try to treat hydrocephalus.  Her fourth ventricle is completely casted and dilated with pressure on the brainstem.  There is no surgery that can be done.  We believe all treatment is futile and recommend comfort care measures.   Eustace Moore 12/27/2019 5:35 PM

## 2020-01-04 NOTE — ED Notes (Signed)
Neurosurgery at bedside.  Verbalized not to give Mannitol at this time

## 2020-01-04 DEATH — deceased

## 2020-03-18 ENCOUNTER — Other Ambulatory Visit: Payer: No Typology Code available for payment source

## 2021-01-23 IMAGING — DX DG CHEST 1V PORT
1 series · 1 of 1 positions shown · non-contrast
Comparison: 05/11/2017

CLINICAL DATA: Post intubation

EXAM:
PORTABLE CHEST 1 VIEW

[chest ap]
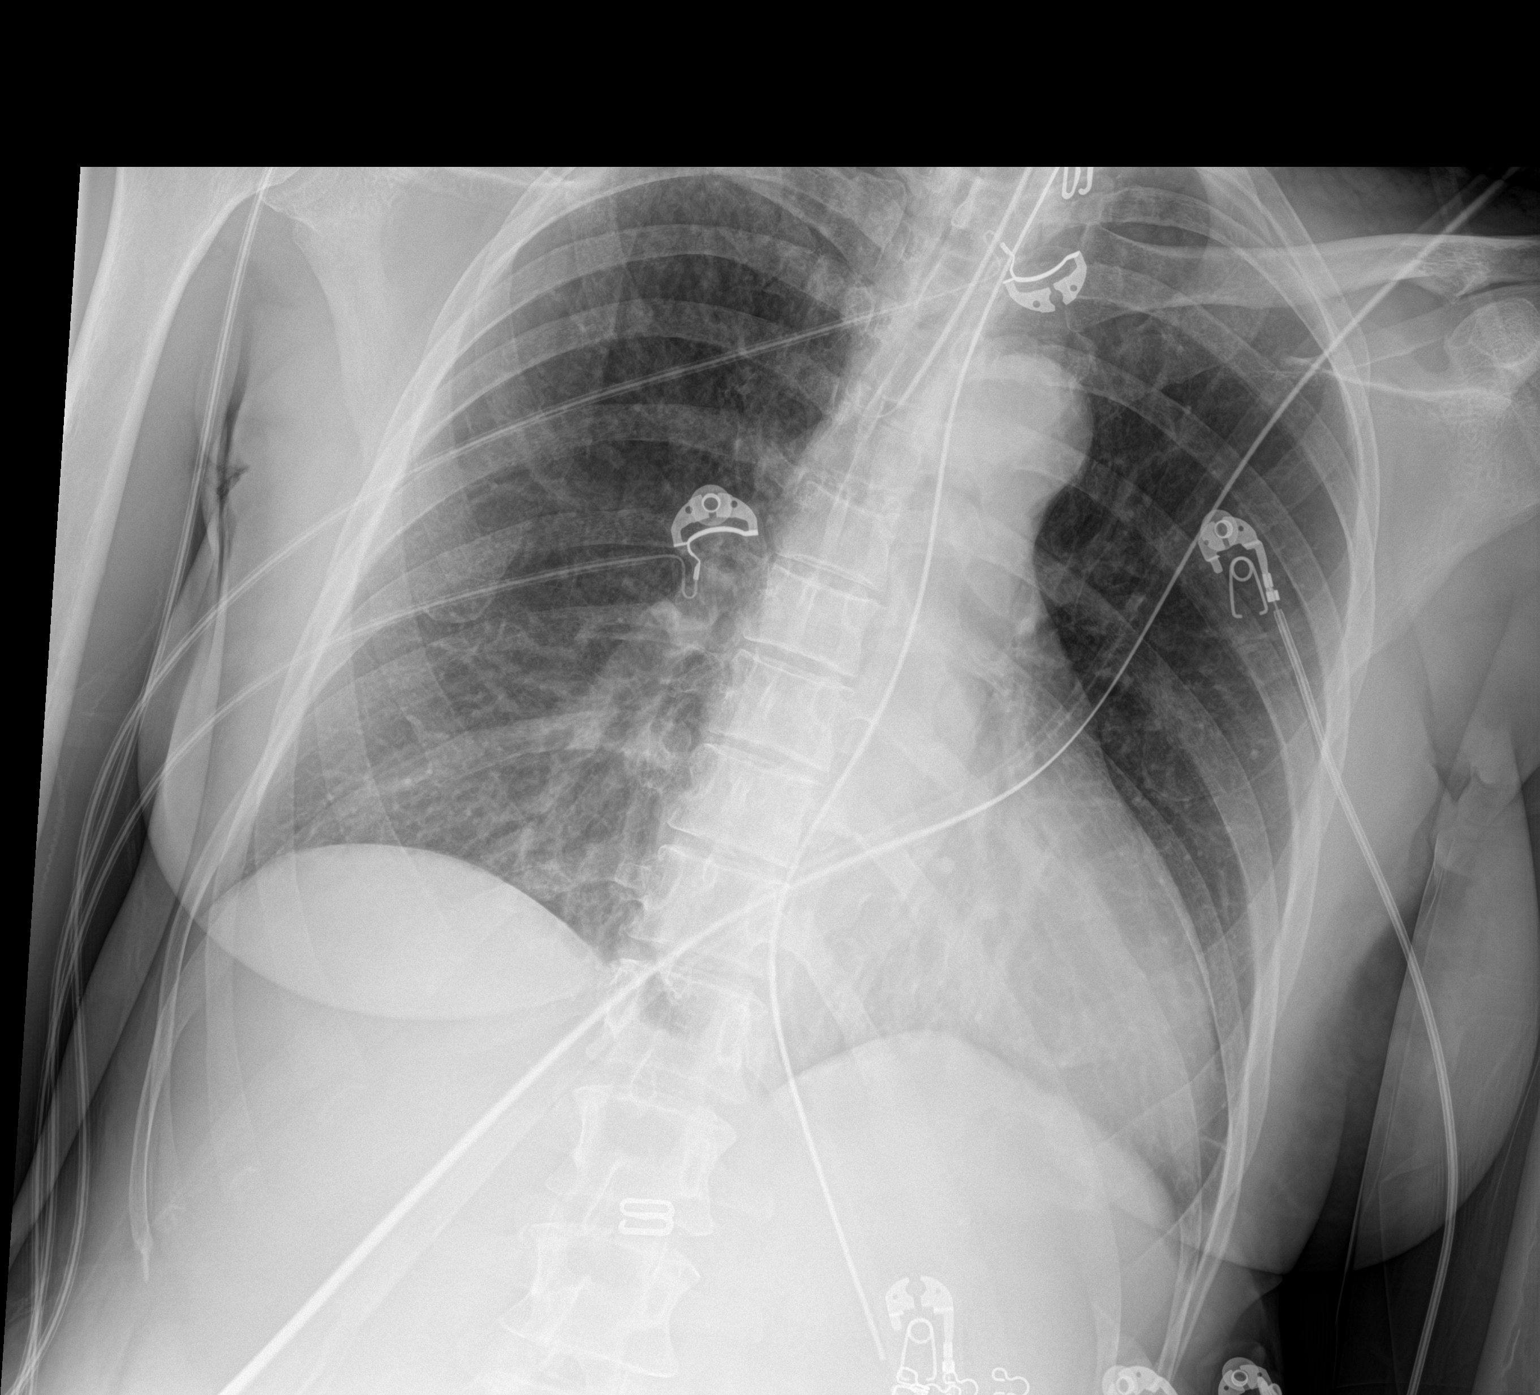

[1 of 1 positions shown; findings below may reference images not displayed]

FINDINGS: Endotracheal to is 2 cm above the carina. NG tube tip is in the
stomach. Heart is borderline in size. No confluent opacities or
effusions. No acute bony abnormality.
IMPRESSION: Support devices in expected position as above.

No acute cardiopulmonary disease.
